# Patient Record
Sex: Male | Born: 1955 | Race: White | Hispanic: No | Marital: Single | Smoking: Former smoker
Health system: Southern US, Community
[De-identification: ages and names within clinical notes are randomized; demographics above are authoritative.]

## PROBLEM LIST (undated history)

## (undated) DIAGNOSIS — Z9889 Other specified postprocedural states: Secondary | ICD-10-CM

## (undated) DIAGNOSIS — R112 Nausea with vomiting, unspecified: Secondary | ICD-10-CM

## (undated) DIAGNOSIS — I10 Essential (primary) hypertension: Secondary | ICD-10-CM

## (undated) DIAGNOSIS — F32A Depression, unspecified: Secondary | ICD-10-CM

## (undated) DIAGNOSIS — F329 Major depressive disorder, single episode, unspecified: Secondary | ICD-10-CM

## (undated) DIAGNOSIS — I499 Cardiac arrhythmia, unspecified: Secondary | ICD-10-CM

## (undated) HISTORY — PX: OTHER SURGICAL HISTORY: SHX169

## (undated) HISTORY — PX: NECK SURGERY: SHX720

---

## 2013-02-19 ENCOUNTER — Encounter (HOSPITAL_BASED_OUTPATIENT_CLINIC_OR_DEPARTMENT_OTHER): Payer: Self-pay | Admitting: Emergency Medicine

## 2013-02-19 ENCOUNTER — Emergency Department (HOSPITAL_BASED_OUTPATIENT_CLINIC_OR_DEPARTMENT_OTHER)
Admission: EM | Admit: 2013-02-19 | Discharge: 2013-02-19 | Disposition: A | Discharge status: Home / Self Care | Payer: Self-pay | Attending: Emergency Medicine | Admitting: Emergency Medicine

## 2013-02-19 DIAGNOSIS — Z87891 Personal history of nicotine dependence: Secondary | ICD-10-CM | POA: Insufficient documentation

## 2013-02-19 DIAGNOSIS — T792XXA Traumatic secondary and recurrent hemorrhage and seroma, initial encounter: Secondary | ICD-10-CM

## 2013-02-19 DIAGNOSIS — W010XXA Fall on same level from slipping, tripping and stumbling without subsequent striking against object, initial encounter: Secondary | ICD-10-CM | POA: Insufficient documentation

## 2013-02-19 DIAGNOSIS — S01409A Unspecified open wound of unspecified cheek and temporomandibular area, initial encounter: Secondary | ICD-10-CM | POA: Insufficient documentation

## 2013-02-19 DIAGNOSIS — Y939 Activity, unspecified: Secondary | ICD-10-CM | POA: Insufficient documentation

## 2013-02-19 DIAGNOSIS — R000 Tachycardia, unspecified: Secondary | ICD-10-CM | POA: Insufficient documentation

## 2013-02-19 DIAGNOSIS — Y929 Unspecified place or not applicable: Secondary | ICD-10-CM | POA: Insufficient documentation

## 2013-02-19 DIAGNOSIS — Z23 Encounter for immunization: Secondary | ICD-10-CM | POA: Insufficient documentation

## 2013-02-19 LAB — BASIC METABOLIC PANEL
BUN: 12 mg/dL (ref 6–23)
CO2: 22 mEq/L (ref 19–32)
Calcium: 8.9 mg/dL (ref 8.4–10.5)
Creatinine, Ser: 0.8 mg/dL (ref 0.50–1.35)
Glucose, Bld: 126 mg/dL — ABNORMAL HIGH (ref 70–99)

## 2013-02-19 LAB — CBC WITH DIFFERENTIAL/PLATELET
Basophils Absolute: 0 10*3/uL (ref 0.0–0.1)
Eosinophils Absolute: 0.2 10*3/uL (ref 0.0–0.7)
Eosinophils Relative: 2 % (ref 0–5)
HCT: 46.6 % (ref 39.0–52.0)
Lymphocytes Relative: 19 % (ref 12–46)
MCH: 31.9 pg (ref 26.0–34.0)
MCV: 92.8 fL (ref 78.0–100.0)
Monocytes Absolute: 0.9 10*3/uL (ref 0.1–1.0)
Neutro Abs: 6.1 10*3/uL (ref 1.7–7.7)
RBC: 5.02 MIL/uL (ref 4.22–5.81)
RDW: 13.7 % (ref 11.5–15.5)
WBC: 8.8 10*3/uL (ref 4.0–10.5)

## 2013-02-19 MED ORDER — TETANUS-DIPHTH-ACELL PERTUSSIS 5-2.5-18.5 LF-MCG/0.5 IM SUSP
0.5000 mL | Freq: Once | INTRAMUSCULAR | Status: AC
  Administered 2013-02-19: 0.5 mL via INTRAMUSCULAR
  Filled 2013-02-19: qty 0.5

## 2013-02-19 MED ORDER — "THROMBI-PAD 3""X3"" EX PADS"
1.0000 | MEDICATED_PAD | Freq: Once | CUTANEOUS | Status: AC
  Administered 2013-02-19: 1 via TOPICAL

## 2013-02-19 NOTE — ED Provider Notes (Signed)
CSN: [id number]     Arrival date & time 02/19/13  0703 History   First MD Initiated Contact with Patient 02/19/13 0715     Chief Complaint  Patient presents with  . Head Injury   (Consider location/radiation/quality/duration/timing/severity/associated sxs/prior Treatment) Patient is a 57 y.o. male presenting with facial injury. The history is provided by the patient. No language interpreter was used.  Facial Injury Mechanism of injury:  Fall Location:  L cheek Time since incident:  1 week Pain details:    Quality:  Aching   Severity:  Mild   Duration:  1 week   Timing:  Constant   Progression:  Worsening Chronicity:  New Foreign body present:  No foreign bodies Worsened by:  Nothing tried Ineffective treatments:  Ice pack (pressure) Associated symptoms: no congestion, no headaches, no nausea, no rhinorrhea and no vomiting   Associated symptoms comment:  Lightheadedness Risk factors: alcohol use   Risk factors: no prior injuries to these areas     History reviewed. No pertinent past medical history. History reviewed. No pertinent past surgical history. History reviewed. No pertinent family history. History  Substance Use Topics  . Smoking status: Former Games developer  . Smokeless tobacco: Not on file  . Alcohol Use: 7.2 oz/week    12 Cans of beer per week     Comment: per day    Review of Systems  Constitutional: Negative for fever, activity change, appetite change and fatigue.  HENT: Negative for congestion, facial swelling, rhinorrhea and trouble swallowing.   Eyes: Negative for photophobia and pain.  Respiratory: Negative for cough, chest tightness and shortness of breath.   Cardiovascular: Negative for chest pain and leg swelling.  Gastrointestinal: Negative for nausea, vomiting, abdominal pain, diarrhea and constipation.  Endocrine: Negative for polydipsia and polyuria.  Genitourinary: Negative for dysuria, urgency, decreased urine volume and difficulty urinating.    Musculoskeletal: Negative for back pain and gait problem.  Skin: Positive for wound. Negative for color change and rash.  Allergic/Immunologic: Negative for immunocompromised state.  Neurological: Negative for dizziness, facial asymmetry, speech difficulty, weakness, numbness and headaches.  Psychiatric/Behavioral: Negative for confusion, decreased concentration and agitation.    Allergies  Review of patient's allergies indicates no known allergies.  Home Medications  No current outpatient prescriptions on file. BP 179/126  Pulse 128  Temp(Src) 98.9 F (37.2 C) (Oral)  Resp 18  Ht 5\' 9"  (1.753 m)  Wt 190 lb (86.183 kg)  BMI 28.05 kg/m2  SpO2 100% Physical Exam  Constitutional: He is oriented to person, place, and time. He appears well-developed and well-nourished. No distress.  HENT:  Head: Normocephalic and atraumatic.    Mouth/Throat: No oropharyngeal exudate.  Eyes: Pupils are equal, round, and reactive to light.  Neck: Normal range of motion. Neck supple.  Cardiovascular: Regular rhythm and normal heart sounds.  Tachycardia present.  Exam reveals no gallop and no friction rub.   No murmur heard. Pulmonary/Chest: Effort normal and breath sounds normal. No respiratory distress. He has no wheezes. He has no rales.  Abdominal: Soft. Bowel sounds are normal. He exhibits no distension and no mass. There is no tenderness. There is no rebound and no guarding.  Musculoskeletal: Normal range of motion. He exhibits no edema and no tenderness.  Neurological: He is alert and oriented to person, place, and time.  Skin: Skin is warm and dry.  Psychiatric: He has a normal mood and affect.    ED Course  Procedures (including critical care time) Labs Review Labs  Reviewed  BASIC METABOLIC PANEL - Abnormal; Notable for the following:    Glucose, Bld 126 (*)    All other components within normal limits  CBC WITH DIFFERENTIAL   Imaging Review No results found.  EKG Interpretation    None       MDM   1. Late effect of facial wound, initial encounter   2. Bleeding from wound, initial encounter    Pt is a 57 y.o. male with Pmhx as above who presents with bleeding wound to L cheek after hitting face on fireplace log rack about 1 week ago. Pt states he must of pulled the scab off and it has been bleeding intermittently for about 2 days.  On PE, Pt tachycardic, hypertensive, but in NAD.  Wound is an avulsion type wound with central puncture w/ dark, oozing bleeding c/w venous bleed.  Pressure applied.  Thrombi-pad will be placed. Pt states he has fel somewhat lightheaded.  He will not consent to a tetanus update.    9:02 AM Bleeding controlled w/ application of thrombi-pad.  Tetanus updated.  Hb stable.  Pt safe for d/c.  Return precautions given for new or worsening symptoms including uncontrolled bleeding. Pt asked to est w/ PCP for blood pressure monitoring.     Shanna Cisco, MD 02/19/13 [phone]

## 2013-02-19 NOTE — ED Notes (Signed)
Pt amb to room 9 with quick steady gait in nad. Pt reports trip and fall onto a fireplace grate last Tuesday. Pt states he cleaned it up and thought it would heal without intervention. Pt reports some swelling to left cheek area, states "I must have knocked the scab off Monday and it's been oozing ever since". Pt went to work this am and his boss told him he could not work with his face bleeding. Small puncture wound noted to left cheek. Pt states td is not utd "but I don't want one!". Denies any other injuries or c/o.

## 2013-02-19 NOTE — ED Notes (Signed)
Cheek cleansed with wound cleanser, dsd held with pressure to cheek by pt.

## 2013-02-19 NOTE — ED Notes (Signed)
Thrombi pad applied to cheek with pressure held by pt. Pt assisted to position of comfort, labs drawn, placed on cardiac monitoring. Pt denies any c/o.

## 2013-02-19 NOTE — ED Notes (Signed)
MD at bedside. 

## 2013-03-27 ENCOUNTER — Emergency Department (INDEPENDENT_AMBULATORY_CARE_PROVIDER_SITE_OTHER)
Admission: EM | Admit: 2013-03-27 | Discharge: 2013-03-27 | Disposition: A | Discharge status: Home / Self Care | Payer: Self-pay | Source: Home / Self Care | Attending: Family Medicine | Admitting: Family Medicine

## 2013-03-27 ENCOUNTER — Encounter (HOSPITAL_COMMUNITY): Payer: Self-pay | Admitting: Emergency Medicine

## 2013-03-27 DIAGNOSIS — I10 Essential (primary) hypertension: Secondary | ICD-10-CM

## 2013-03-27 DIAGNOSIS — H1130 Conjunctival hemorrhage, unspecified eye: Secondary | ICD-10-CM

## 2013-03-27 MED ORDER — LISINOPRIL 10 MG PO TABS: 10.0000 mg | ORAL_TABLET | Freq: Every day | ORAL | Status: DC

## 2013-03-27 NOTE — ED Provider Notes (Signed)
Alex Torres is a 58 y.o. male who presents to Urgent Care today for right eye redness  1) patient notes pain with left eye redness after straining to lift a heavy pipe today at work. He is completely asymptomatic. His coworkers recommend that he came in today to get evaluation.  2) elevated blood pressure. Patient was told that he had blood pressure at his last visit. He is asymptomatic no chest pains palpitations or shortness of breath. No syncope or dizziness    History reviewed. No pertinent past medical history. History  Substance Use Topics  . Smoking status: Former Research scientist (life sciences)  . Smokeless tobacco: Not on file  . Alcohol Use: 7.2 oz/week    12 Cans of beer per week     Comment: per day   ROS as above Medications: No current facility-administered medications for this encounter.   Current Outpatient Prescriptions  Medication Sig Dispense Refill  . lisinopril (PRINIVIL,ZESTRIL) 10 MG tablet Take 1 tablet (10 mg total) by mouth daily.  30 tablet  1    Exam:  BP 181/90  Pulse 90  Temp(Src) 98.3 F (36.8 C) (Oral)  Resp 18  SpO2 100% Gen: Well NAD HEENT: EOMI,  MMM right eye conjunctival hemorrhage involving the 6:00 to 9:00 position of the sclera only. PERRLA Lungs: Normal work of breathing. CTABL Heart: RRR no MRG Abd: NABS, Soft. NT, ND Exts: Brisk capillary refill, warm and well perfused.    Assessment and Plan: 58 y.o. male with  1) right eye subconjunctival hemorrhage. Watchful waiting reassurance.  2) elevated blood pressure: Hypertension. Patient has had 2 separate readings with elevated blood pressure now.  Plan to start treatment with lisinopril. Creatinine and potassium were normal about a month ago. Followup with primary care provider  Discussed warning signs or symptoms. Please see discharge instructions. Patient expresses understanding.    Gregor Hams, MD 03/27/13 1325

## 2013-03-27 NOTE — ED Notes (Signed)
C/O BLOOD SHOT RIGHT EYE WHICH STARTED THIS MORNING STATES HE DID STRAINED THIS MORNING AS HE LIFTED A HEAVY PIPE

## 2013-03-27 NOTE — Discharge Instructions (Signed)
Thank you for coming in today. Your eye will slowly get better over a period of a few weeks. Your blood pressure continues to be elevated. Start taking lisinopril daily tomorrow Followup with a primary care Dr. soon as possible Please see Bonna Gains to qualify for reduced or free medical services within the [place].  Call her at [phone] today. [address], [place] [address] [phone]   Arterial Hypertension Arterial hypertension (high blood pressure) is a condition of elevated pressure in your blood vessels. Hypertension over a long period of time is a risk factor for strokes, heart attacks, and heart failure. It is also the leading cause of kidney (renal) failure.  CAUSES   In Adults -- Over 90% of all hypertension has no known cause. This is called essential or primary hypertension. In the other 10% of people with hypertension, the increase in blood pressure is caused by another disorder. This is called secondary hypertension. Important causes of secondary hypertension are:  Heavy alcohol use.  Obstructive sleep apnea.  Hyperaldosterosim (Conn's syndrome).  Steroid use.  Chronic kidney failure.  Hyperparathyroidism.  Medications.  Renal artery stenosis.  Pheochromocytoma.  Cushing's disease.  Coarctation of the aorta.  Scleroderma renal crisis.  Licorice (in excessive amounts).  Drugs (cocaine, methamphetamine). Your caregiver can explain any items above that apply to you.  In Children -- Secondary hypertension is more common and should always be considered.  Pregnancy -- Few women of childbearing age have high blood pressure. However, up to 10% of them develop hypertension of pregnancy. Generally, this will not harm the woman. It may be a sign of 3 complications of pregnancy: preeclampsia, HELLP syndrome, and eclampsia. Follow up and control with medication is necessary. SYMPTOMS    This condition normally does not produce any noticeable symptoms. It is usually found during a routine exam.  Malignant hypertension is a late problem of high blood pressure. It may have the following symptoms:  Headaches.  Blurred vision.  End-organ damage (this means your kidneys, heart, lungs, and other organs are being damaged).  Stressful situations can increase the blood pressure. If a person with normal blood pressure has their blood pressure go up while being seen by their caregiver, this is often termed "white coat hypertension." Its importance is not known. It may be related with eventually developing hypertension or complications of hypertension.  Hypertension is often confused with mental tension, stress, and anxiety. DIAGNOSIS  The diagnosis is made by 3 separate blood pressure measurements. They are taken at least 1 week apart from each other. If there is organ damage from hypertension, the diagnosis may be made without repeat measurements. Hypertension is usually identified by having blood pressure readings:  Above 140/90 mmHg measured in both arms, at 3 separate times, over a couple weeks.  Over 130/80 mmHg should be considered a risk factor and may require treatment in patients with diabetes. Blood pressure readings over 120/80 mmHg are called "pre-hypertension" even in non-diabetic patients. To get a true blood pressure measurement, use the following guidelines. Be aware of the factors that can alter blood pressure readings.  Take measurements at least 1 hour after caffeine.  Take measurements 30 minutes after smoking and without any stress. This is another reason to quit smoking  it raises your blood pressure.  Use a proper cuff size. Ask your caregiver if you are not sure about your cuff size.  Most home blood pressure cuffs are automatic. They  will measure systolic and diastolic pressures. The systolic pressure is the pressure reading at the start of sounds.  Diastolic pressure is the pressure at which the sounds disappear. If you are elderly, measure pressures in multiple postures. Try sitting, lying or standing.  Sit at rest for a minimum of 5 minutes before taking measurements.  You should not be on any medications like decongestants. These are found in many cold medications.  Record your blood pressure readings and review them with your caregiver. If you have hypertension:  Your caregiver may do tests to be sure you do not have secondary hypertension (see "causes" above).  Your caregiver may also look for signs of metabolic syndrome. This is also called Syndrome X or Insulin Resistance Syndrome. You may have this syndrome if you have type 2 diabetes, abdominal obesity, and abnormal blood lipids in addition to hypertension.  Your caregiver will take your medical and family history and perform a physical exam.  Diagnostic tests may include blood tests (for glucose, cholesterol, potassium, and kidney function), a urinalysis, or an EKG. Other tests may also be necessary depending on your condition. PREVENTION  There are important lifestyle issues that you can adopt to reduce your chance of developing hypertension:  Maintain a normal weight.  Limit the amount of salt (sodium) in your diet.  Exercise often.  Limit alcohol intake.  Get enough potassium in your diet. Discuss specific advice with your caregiver.  Follow a DASH diet (dietary approaches to stop hypertension). This diet is rich in fruits, vegetables, and low-fat dairy products, and avoids certain fats. PROGNOSIS  Essential hypertension cannot be cured. Lifestyle changes and medical treatment can lower blood pressure and reduce complications. The prognosis of secondary hypertension depends on the underlying cause. Many people whose hypertension is controlled with medicine or lifestyle changes can live a normal, healthy life.  RISKS AND COMPLICATIONS  While high blood pressure  alone is not an illness, it often requires treatment due to its short- and long-term effects on many organs. Hypertension increases your risk for:  CVAs or strokes (cerebrovascular accident).  Heart failure due to chronically high blood pressure (hypertensive cardiomyopathy).  Heart attack (myocardial infarction).  Damage to the retina (hypertensive retinopathy).  Kidney failure (hypertensive nephropathy). Your caregiver can explain list items above that apply to you. Treatment of hypertension can significantly reduce the risk of complications. TREATMENT   For overweight patients, weight loss and regular exercise are recommended. Physical fitness lowers blood pressure.  Mild hypertension is usually treated with diet and exercise. A diet rich in fruits and vegetables, fat-free dairy products, and foods low in fat and salt (sodium) can help lower blood pressure. Decreasing salt intake decreases blood pressure in a 1/3 of people.  Stop smoking if you are a smoker. The steps above are highly effective in reducing blood pressure. While these actions are easy to suggest, they are difficult to achieve. Most patients with moderate or severe hypertension end up requiring medications to bring their blood pressure down to a normal level. There are several classes of medications for treatment. Blood pressure pills (antihypertensives) will lower blood pressure by their different actions. Lowering the blood pressure by 10 mmHg may decrease the risk of complications by as much as 25%. The goal of treatment is effective blood pressure control. This will reduce your risk for complications. Your caregiver will help you determine the best treatment for you according to your lifestyle. What is excellent treatment for one person, may not be for you.  HOME CARE INSTRUCTIONS   Do not smoke.  Follow the lifestyle changes outlined in the "Prevention" section.  If you are on medications, follow the directions  carefully. Blood pressure medications must be taken as prescribed. Skipping doses reduces their benefit. It also puts you at risk for problems.  Follow up with your caregiver, as directed.  If you are asked to monitor your blood pressure at home, follow the guidelines in the "Diagnosis" section above. SEEK MEDICAL CARE IF:   You think you are having medication side effects.  You have recurrent headaches or lightheadedness.  You have swelling in your ankles.  You have trouble with your vision. SEEK IMMEDIATE MEDICAL CARE IF:   You have sudden onset of chest pain or pressure, difficulty breathing, or other symptoms of a heart attack.  You have a severe headache.  You have symptoms of a stroke (such as sudden weakness, difficulty speaking, difficulty walking). MAKE SURE YOU:   Understand these instructions.  Will watch your condition.  Will get help right away if you are not doing well or get worse. Document Released: 02/06/2005 Document Revised: 05/01/2011 Document Reviewed: 09/06/2006 [facility] Patient Information 2014 [place]. Subconjunctival Hemorrhage A subconjunctival hemorrhage is a bright red patch covering a portion of the white of the eye. The white part of the eye is called the sclera, and it is covered by a thin membrane called the conjunctiva. This membrane is clear, except for tiny blood vessels that you can see with the naked eye. When your eye is irritated or inflamed and becomes red, it is because the vessels in the conjunctiva are swollen. Sometimes, a blood vessel in the conjunctiva can break and bleed. When this occurs, the blood builds up between the conjunctiva and the sclera, and spreads out to create a red area. The red spot may be very small at first. It may then spread to cover a larger part of the surface of the eye, or even all of the visible white part of the eye. In almost all cases, the blood will go away and the eye will become white again. Before  completely dissolving, however, the red area may spread. It may also become brownish-yellow in color, before going away. If a lot of blood collects under the conjunctiva, it may look like a bulge on the surface of the eye. This looks scary, but it will also eventually flatten out and go away. Subconjunctival hemorrhages do not cause pain, but if swollen, may cause a feeling of irritation. There is no effect on vision.  CAUSES   The most common cause is mild trauma (rubbing the eye, irritation).  Subconjunctival hemorrhages can happen because of coughing or straining (lifting heavy objects), vomiting, or sneezing.  In some cases, your doctor may want to check your blood pressure. High blood pressure can also cause a sunconjunctival hemorrhage.  Severe trauma or blunt injuries.  Diseases that affect blood clotting (hemophilia, leukemia).  Abnormalities of blood vessels behind the eye (carotid cavernous sinus fistula).  Tumors behind the eye.  Certain drugs (aspirin, coumadin, heparin).  Recent eye surgery. HOME CARE INSTRUCTIONS   Do not worry about the appearance of your eye. You may continue your usual activities.  Often, follow-up is not necessary. SEEK MEDICAL CARE IF:   Your eye becomes painful.  The bleeding does not disappear within 3 weeks.  Bleeding occurs elsewhere, for example, under the skin, in the mouth, or in the other eye.  You have recurring subconjunctival hemorrhages. SEEK  IMMEDIATE MEDICAL CARE IF:   Your vision changes or you have difficulty seeing.  You develop severe headache, persistent vomiting, confusion, or abnormal drowsiness (lethargy).  Your eye seems to bulge or protrude from the eye socket.  You notice the sudden appearance of bruises, or have spontaneous bleeding elsewhere on your body. Document Released: 02/06/2005 Document Revised: 05/01/2011 Document Reviewed: 01/04/2009 [facility] Patient Information 2014 [place].

## 2016-07-24 ENCOUNTER — Telehealth: Payer: Self-pay | Admitting: Hematology

## 2016-07-24 ENCOUNTER — Encounter: Payer: Self-pay | Admitting: Hematology

## 2016-07-24 NOTE — Telephone Encounter (Signed)
Appt has been scheduled for the pt to see Dr. Irene Limbo on 6/20 at 11am. Will fax a letter to the referring to notify the pt and mail a letter.

## 2016-08-09 ENCOUNTER — Ambulatory Visit (HOSPITAL_BASED_OUTPATIENT_CLINIC_OR_DEPARTMENT_OTHER): Payer: Commercial Managed Care - PPO

## 2016-08-09 ENCOUNTER — Encounter: Payer: Self-pay | Admitting: Hematology

## 2016-08-09 ENCOUNTER — Ambulatory Visit (HOSPITAL_BASED_OUTPATIENT_CLINIC_OR_DEPARTMENT_OTHER): Payer: Commercial Managed Care - PPO | Admitting: Hematology

## 2016-08-09 ENCOUNTER — Telehealth: Payer: Self-pay | Admitting: Hematology

## 2016-08-09 VITALS — BP 143/90 | HR 107 | Temp 99.2°F | Resp 18 | Ht 69.0 in | Wt 190.5 lb

## 2016-08-09 DIAGNOSIS — R5383 Other fatigue: Secondary | ICD-10-CM | POA: Diagnosis not present

## 2016-08-09 DIAGNOSIS — D45 Polycythemia vera: Secondary | ICD-10-CM

## 2016-08-09 DIAGNOSIS — Q8901 Asplenia (congenital): Secondary | ICD-10-CM

## 2016-08-09 DIAGNOSIS — D751 Secondary polycythemia: Secondary | ICD-10-CM | POA: Diagnosis not present

## 2016-08-09 DIAGNOSIS — M542 Cervicalgia: Secondary | ICD-10-CM

## 2016-08-09 DIAGNOSIS — I10 Essential (primary) hypertension: Secondary | ICD-10-CM | POA: Diagnosis not present

## 2016-08-09 DIAGNOSIS — Z9081 Acquired absence of spleen: Secondary | ICD-10-CM | POA: Diagnosis not present

## 2016-08-09 LAB — CBC & DIFF AND RETIC
BASO%: 0.4 % (ref 0.0–2.0)
BASOS ABS: 0 10*3/uL (ref 0.0–0.1)
EOS%: 1.4 % (ref 0.0–7.0)
Eosinophils Absolute: 0.1 10*3/uL (ref 0.0–0.5)
HEMATOCRIT: 52 % — AB (ref 38.4–49.9)
HEMOGLOBIN: 17.5 g/dL — AB (ref 13.0–17.1)
IMMATURE RETIC FRACT: 1.7 % — AB (ref 3.00–10.60)
LYMPH%: 20.2 % (ref 14.0–49.0)
MCH: 33.7 pg — ABNORMAL HIGH (ref 27.2–33.4)
MCHC: 33.7 g/dL (ref 32.0–36.0)
MCV: 100 fL — ABNORMAL HIGH (ref 79.3–98.0)
MONO#: 1 10*3/uL — AB (ref 0.1–0.9)
MONO%: 10.7 % (ref 0.0–14.0)
NEUT%: 67.3 % (ref 39.0–75.0)
NEUTROS ABS: 6.3 10*3/uL (ref 1.5–6.5)
Platelets: 189 10*3/uL (ref 140–400)
RBC: 5.2 10*6/uL (ref 4.20–5.82)
RDW: 13 % (ref 11.0–14.6)
RETIC %: 0.78 % — AB (ref 0.80–1.80)
Retic Ct Abs: 40.56 10*3/uL (ref 34.80–93.90)
WBC: 9.4 10*3/uL (ref 4.0–10.3)
lymph#: 1.9 10*3/uL (ref 0.9–3.3)

## 2016-08-09 LAB — COMPREHENSIVE METABOLIC PANEL
ALBUMIN: 3.9 g/dL (ref 3.5–5.0)
ALK PHOS: 72 U/L (ref 40–150)
ALT: 33 U/L (ref 0–55)
AST: 30 U/L (ref 5–34)
Anion Gap: 12 mEq/L — ABNORMAL HIGH (ref 3–11)
BILIRUBIN TOTAL: 0.6 mg/dL (ref 0.20–1.20)
BUN: 13 mg/dL (ref 7.0–26.0)
CO2: 27 mEq/L (ref 22–29)
Calcium: 10.3 mg/dL (ref 8.4–10.4)
Chloride: 104 mEq/L (ref 98–109)
Creatinine: 0.9 mg/dL (ref 0.7–1.3)
EGFR: >90 mL/min/{1.73_m2} (ref >90)
Glucose: 106 mg/dl (ref 70–140)
Potassium: 5 mEq/L (ref 3.5–5.1)
SODIUM: 143 meq/L (ref 136–145)
TOTAL PROTEIN: 7.8 g/dL (ref 6.4–8.3)
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
## 2016-08-09 NOTE — Telephone Encounter (Signed)
Scheduled appt per 6/20 los - Dr. Irene Limbo on pal in 3 weeks - scheduled patient for 4 weeks. Gave patient AVS and calender,.

## 2016-08-09 NOTE — Patient Instructions (Signed)
Thank you for choosing [facility] to provide your oncology and hematology care.  To afford each patient quality time with our providers, please arrive 30 minutes before your scheduled appointment time.  If you arrive late for your appointment, you may be asked to reschedule.  We strive to give you quality time with our providers, and arriving late affects you and other patients whose appointments are after yours.   If you are a no show for multiple scheduled visits, you may be dismissed from the clinic at the providers discretion.    Again, thank you for choosing [facility], our hope is that these requests will decrease the amount of time that you wait before being seen by our physicians.  ______________________________________________________________________  Should you have questions after your visit to the [facility], please contact our office at [phone] between the hours of 8:30 and 4:30 p.m.    Voicemails left after 4:30p.m will not be returned until the following business day.    For prescription refill requests, please have your pharmacy contact us directly.  Please also try to allow 48 hours for prescription requests.    Please contact the scheduling department for questions regarding scheduling.  For scheduling of procedures such as PET scans, CT scans, MRI, Ultrasound, etc please contact central scheduling at [phone].    Resources For Cancer Patients and Caregivers:   Oncolink.org:  A wonderful resource for patients and healthcare providers for information regarding your disease, ways to tract your treatment, what to expect, etc.     American Cancer Society:  800-227-2345  Can help patients locate various types of support and financial assistance  Cancer Care: 1-800-813-HOPE (4673) Provides financial assistance, online support groups, medication/co-pay assistance.    [place] DSS:  336-641-3447 Where to apply for food  stamps, Medicaid, and utility assistance  Medicare Rights Center: 800-333-4114 Helps people with Medicare understand their rights and benefits, navigate the Medicare system, and secure the quality healthcare they deserve  SCAT: 336-333-6589 [place] [employer]'s shared-ride transportation service for eligible riders who have a disability that prevents them from riding the fixed route bus.    For additional information on assistance programs please contact our social worker:   Grier Hock/Abigail Elmore:  [phone]            

## 2016-09-07 ENCOUNTER — Encounter: Payer: Self-pay | Admitting: Hematology

## 2016-09-07 ENCOUNTER — Ambulatory Visit (HOSPITAL_BASED_OUTPATIENT_CLINIC_OR_DEPARTMENT_OTHER): Payer: Commercial Managed Care - PPO | Admitting: Hematology

## 2016-09-07 VITALS — BP 147/95 | HR 106 | Temp 98.9°F | Resp 18 | Ht 69.0 in | Wt 189.9 lb

## 2016-09-07 DIAGNOSIS — R5383 Other fatigue: Secondary | ICD-10-CM

## 2016-09-07 DIAGNOSIS — D751 Secondary polycythemia: Secondary | ICD-10-CM | POA: Diagnosis not present

## 2016-09-07 DIAGNOSIS — Z9081 Acquired absence of spleen: Secondary | ICD-10-CM | POA: Diagnosis not present

## 2016-09-07 NOTE — Patient Instructions (Signed)
Thank you for choosing [facility] to provide your oncology and hematology care.  To afford each patient quality time with our providers, please arrive 30 minutes before your scheduled appointment time.  If you arrive late for your appointment, you may be asked to reschedule.  We strive to give you quality time with our providers, and arriving late affects you and other patients whose appointments are after yours.   If you are a no show for multiple scheduled visits, you may be dismissed from the clinic at the providers discretion.    Again, thank you for choosing [facility], our hope is that these requests will decrease the amount of time that you wait before being seen by our physicians.  ______________________________________________________________________  Should you have questions after your visit to the [facility], please contact our office at [phone] between the hours of 8:30 and 4:30 p.m.    Voicemails left after 4:30p.m will not be returned until the following business day.    For prescription refill requests, please have your pharmacy contact us directly.  Please also try to allow 48 hours for prescription requests.    Please contact the scheduling department for questions regarding scheduling.  For scheduling of procedures such as PET scans, CT scans, MRI, Ultrasound, etc please contact central scheduling at [phone].    Resources For Cancer Patients and Caregivers:   Oncolink.org:  A wonderful resource for patients and healthcare providers for information regarding your disease, ways to tract your treatment, what to expect, etc.     American Cancer Society:  800-227-2345  Can help patients locate various types of support and financial assistance  Cancer Care: 1-800-813-HOPE (4673) Provides financial assistance, online support groups, medication/co-pay assistance.    [place] DSS:  336-641-3447 Where to apply for food  stamps, Medicaid, and utility assistance  Medicare Rights Center: 800-333-4114 Helps people with Medicare understand their rights and benefits, navigate the Medicare system, and secure the quality healthcare they deserve  SCAT: 336-333-6589 [place] [employer]'s shared-ride transportation service for eligible riders who have a disability that prevents them from riding the fixed route bus.    For additional information on assistance programs please contact our social worker:   Grier Hock/Abigail Elmore:  [phone]            
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
## 2016-10-02 ENCOUNTER — Other Ambulatory Visit: Payer: Self-pay | Admitting: Family Medicine

## 2016-10-02 DIAGNOSIS — M5412 Radiculopathy, cervical region: Secondary | ICD-10-CM

## 2016-10-02 DIAGNOSIS — Z77018 Contact with and (suspected) exposure to other hazardous metals: Secondary | ICD-10-CM

## 2016-10-12 ENCOUNTER — Telehealth: Payer: Self-pay | Admitting: Hematology

## 2016-10-12 NOTE — Telephone Encounter (Signed)
[facility] FAXED RECORDS TO [place] [phone]

## 2016-10-13 ENCOUNTER — Other Ambulatory Visit: Payer: Commercial Managed Care - PPO

## 2016-12-12 ENCOUNTER — Other Ambulatory Visit: Payer: Self-pay | Admitting: Neurosurgery

## 2017-01-10 NOTE — Progress Notes (Signed)
PCP:  Cardiologist:  EKG:  Stress test:  ECHO:  Cardiac Cath:  Chest x-ray

## 2017-01-10 NOTE — Pre-Procedure Instructions (Signed)
Alex Torres  01/10/2017      [facility] - [place] - [address] [place] [address] Phone: [phone] Fax: [phone]    Your procedure is scheduled on January 17, 2017.  Report to [facility] Admitting at 800 AM.  Call this number if you have problems the morning of surgery:  [phone]   Remember:  Do not eat food or drink liquids after midnight.  Take these medicines the morning of surgery with A SIP OF WATER (none).  7 days prior to surgery STOP taking any meloxicam (mobic), Aspirin (unless otherwise instructed by your surgeon), Aleve, Naproxen, Ibuprofen, Motrin, Advil, Goody's, BC's, all herbal medications, fish oil, and all vitamins  Continue all other medications as instructed by your physician except follow the above medication instructions before surgery   Do not wear jewelry, make-up or nail polish.  Do not wear lotions, powders, or perfumes, or deoderant.  Do not shave 48 hours prior to surgery.    Do not bring valuables to the hospital.  [facility] is not responsible for any belongings or valuables.  Contacts, dentures or bridgework may not be worn into surgery.  Leave your suitcase in the car.  After surgery it may be brought to your room.  For patients admitted to the hospital, discharge time will be determined by your treatment team.  Patients discharged the day of surgery will not be allowed to drive home.    Special instructions:  [place]- Preparing For Surgery  Before surgery, you can play an important role. Because skin is not sterile, your skin needs to be as free of germs as possible. You can reduce the number of germs on your skin by washing with CHG (chlorahexidine gluconate) Soap before surgery.  CHG is an antiseptic cleaner which kills germs and bonds with the skin to continue killing germs even after washing.  Please do not use if you have an  allergy to CHG or antibacterial soaps. If your skin becomes reddened/irritated stop using the CHG.  Do not shave (including legs and underarms) for at least 48 hours prior to first CHG shower. It is OK to shave your face.  Please follow these instructions carefully.   1. Shower the NIGHT BEFORE SURGERY and the MORNING OF SURGERY with CHG.   2. If you chose to wash your hair, wash your hair first as usual with your normal shampoo.  3. After you shampoo, rinse your hair and body thoroughly to remove the shampoo.  4. Use CHG as you would any other liquid soap. You can apply CHG directly to the skin and wash gently with a scrungie or a clean washcloth.   5. Apply the CHG Soap to your body ONLY FROM THE NECK DOWN.  Do not use on open wounds or open sores. Avoid contact with your eyes, ears, mouth and genitals (private parts). Wash Face and genitals (private parts)  with your normal soap.  6. Wash thoroughly, paying special attention to the area where your surgery will be performed.  7. Thoroughly rinse your body with warm water from the neck down.  8. DO NOT shower/wash with your normal soap after using and rinsing off the CHG Soap.  9. Pat yourself dry with a CLEAN TOWEL.  10. Wear CLEAN PAJAMAS to bed the night before surgery, wear comfortable clothes the morning of surgery  11. Place CLEAN SHEETS on your bed the night  of your first shower and DO NOT SLEEP WITH PETS.    Day of Surgery: Do not apply any deodorants/lotions. Please wear clean clothes to the hospital/surgery center.     Please read over the following fact sheets that you were given. Pain Booklet, Coughing and Deep Breathing and Surgical Site Infection Prevention

## 2017-01-12 ENCOUNTER — Inpatient Hospital Stay (HOSPITAL_COMMUNITY)
Admission: RE | Admit: 2017-01-12 | Discharge: 2017-01-12 | Disposition: A | Discharge status: Home / Self Care | Payer: Commercial Managed Care - PPO | Source: Ambulatory Visit

## 2017-01-16 ENCOUNTER — Other Ambulatory Visit: Payer: Self-pay

## 2017-01-16 ENCOUNTER — Encounter (HOSPITAL_COMMUNITY)
Admission: RE | Admit: 2017-01-16 | Discharge: 2017-01-16 | Disposition: A | Discharge status: Home / Self Care | Payer: Commercial Managed Care - PPO | Source: Ambulatory Visit | Attending: Neurosurgery | Admitting: Neurosurgery

## 2017-01-16 ENCOUNTER — Encounter (HOSPITAL_COMMUNITY): Payer: Self-pay

## 2017-01-16 HISTORY — DX: Cardiac arrhythmia, unspecified: I49.9

## 2017-01-16 HISTORY — DX: Depression, unspecified: F32.A

## 2017-01-16 HISTORY — DX: Essential (primary) hypertension: I10

## 2017-01-16 HISTORY — DX: Other specified postprocedural states: Z98.890

## 2017-01-16 HISTORY — DX: Major depressive disorder, single episode, unspecified: F32.9

## 2017-01-16 HISTORY — DX: Other specified postprocedural states: R11.2

## 2017-01-16 HISTORY — DX: Nausea with vomiting, unspecified: R11.2

## 2017-01-16 LAB — CBC
HEMATOCRIT: 54.3 % — AB (ref 39.0–52.0)
HEMOGLOBIN: 18.4 g/dL — AB (ref 13.0–17.0)
MCH: 33.7 pg (ref 26.0–34.0)
MCHC: 33.9 g/dL (ref 30.0–36.0)
MCV: 99.5 fL (ref 78.0–100.0)
Platelets: 224 10*3/uL (ref 150–400)
RBC: 5.46 MIL/uL (ref 4.22–5.81)
RDW: 13.5 % (ref 11.5–15.5)
WBC: 11.7 10*3/uL — ABNORMAL HIGH (ref 4.0–10.5)

## 2017-01-16 LAB — COMPREHENSIVE METABOLIC PANEL
ALK PHOS: 71 U/L (ref 38–126)
ALT: 22 U/L (ref 17–63)
ANION GAP: 8 (ref 5–15)
AST: 26 U/L (ref 15–41)
Albumin: 3.9 g/dL (ref 3.5–5.0)
BILIRUBIN TOTAL: 0.5 mg/dL (ref 0.3–1.2)
BUN: 19 mg/dL (ref 6–20)
CALCIUM: 9.9 mg/dL (ref 8.9–10.3)
CO2: 24 mmol/L (ref 22–32)
CREATININE: 0.82 mg/dL (ref 0.61–1.24)
Chloride: 105 mmol/L (ref 101–111)
Glucose, Bld: 117 mg/dL — ABNORMAL HIGH (ref 65–99)
Potassium: 4.4 mmol/L (ref 3.5–5.1)
SODIUM: 137 mmol/L (ref 135–145)
TOTAL PROTEIN: 7.6 g/dL (ref 6.5–8.1)

## 2017-01-16 LAB — TYPE AND SCREEN
ABO/RH(D): O POS
Antibody Screen: NEGATIVE

## 2017-01-16 LAB — ABO/RH: ABO/RH(D): O POS

## 2017-01-16 LAB — SURGICAL PCR SCREEN
MRSA, PCR: NEGATIVE
Staphylococcus aureus: NEGATIVE

## 2017-01-16 NOTE — Progress Notes (Addendum)
PCP is Dr. Eliberto Ivory Denies ever seeing a cardiologist. Denies chest pain, fever, or cough  Denies ever having a card cath, stress test, or echo. Denies ever having an EKG. bp today 166/100 rechecked 145/100 Hr 120 rechecked 118 pt states he is stressed and had been in a lot of pain  Willeen Cass, FNP called and informed.

## 2017-01-16 NOTE — Progress Notes (Signed)
Anesthesia Chart Review:  Pt is a 61 year old male scheduled for C3-4, C4-5, C5-6 ACDF on 01/17/2017 with Kary Kos, MD  - PCP is Horald Pollen, MD - Saw hematologist Sullivan Lone, MD for polycythemia, thought secondary to sleep apnea or due to hemoconcentration from dehydration from alcohol use.  Last office visit 09/07/16. F/u prn recommended.   PMH includes:  HTN, dysrhythmia (unspecified), polycythemia, post-op N/V.  Drinks 6 beers per day. Smoked briefly as a teen.  Dips tobacco.  BMI 29. S/p splenectomy at age 65.   Medications include: lisinopril, meloxicam, ibuprofen.   VS 166/100, 140/100 HR 120, 118  Preoperative labs reviewed.    EKG 01/16/17:  - Sinus tachycardia (111 bpm) with 1st degree A-V block. RBBB. Septal infarct, age undetermined. Inferior infarct, age undetermined - Pt has not had an EKG previously.   I saw pt in pre-admission testing for uncontrolled HTN and tachycardia.  HR regular but is tachycardic.  No murmur.  Lungs CTA B.  No peripheral edema or carotid bruit.  Pt did take lisinopril today as prescribed. Denies cocaine or caffeine use.  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  Denies CP, SOB, dizziness.  Does feel his heart beat is irregular at times (this has been going on for years), but has never been told by a health care provider that his HR is irregular or that he has an arrhythmia.  Does not feel HR is fast or irregular today. Pt reports high stress today (was late for pre-admission testing, is running multiple errands today in preparation for surgery tomorrow).    Reviewed case with Dr. Ermalene Postin.   If no changes, I anticipate pt can proceed with surgery as scheduled. Pt understands if HR and BP higher tomorrow, case could be cancelled.   Willeen Cass, FNP-BC [facility] Stay [facility]/Anesthesiology Phone: [phone] 01/16/2017 3:57 PM

## 2017-01-16 NOTE — Progress Notes (Signed)
   01/16/17 1442  OBSTRUCTIVE SLEEP APNEA  Have you ever been diagnosed with sleep apnea through a sleep study? No  Do you snore loudly (loud enough to be heard through closed doors)?  1  Do you often feel tired, fatigued, or sleepy during the daytime (such as falling asleep during driving or talking to someone)? 0  Has anyone observed you stop breathing during your sleep? 1  Do you have, or are you being treated for high blood pressure? 1  BMI more than 35 kg/m2? 0  Age > 50 (1-yes) 1  Neck circumference greater than:Male 16 inches or larger, Male 17inches or larger? 0  Male Gender (Yes=1) 1  Obstructive Sleep Apnea Score 5  Score 5 or greater  Results sent to PCP

## 2017-01-17 ENCOUNTER — Inpatient Hospital Stay (HOSPITAL_COMMUNITY): Payer: Commercial Managed Care - PPO | Admitting: Anesthesiology

## 2017-01-17 ENCOUNTER — Encounter (HOSPITAL_COMMUNITY)
Admission: RE | Disposition: A | Discharge status: Home / Self Care | Payer: Self-pay | Source: Ambulatory Visit | Attending: Neurosurgery

## 2017-01-17 ENCOUNTER — Inpatient Hospital Stay (HOSPITAL_COMMUNITY): Payer: Commercial Managed Care - PPO

## 2017-01-17 ENCOUNTER — Inpatient Hospital Stay (HOSPITAL_COMMUNITY)
Admission: RE | Admit: 2017-01-17 | Discharge: 2017-01-18 | DRG: 552 | Disposition: A | Discharge status: Home / Self Care | Payer: Commercial Managed Care - PPO | Source: Ambulatory Visit | Attending: Neurosurgery | Admitting: Neurosurgery

## 2017-01-17 ENCOUNTER — Encounter (HOSPITAL_COMMUNITY): Payer: Self-pay

## 2017-01-17 ENCOUNTER — Inpatient Hospital Stay (HOSPITAL_COMMUNITY): Payer: Commercial Managed Care - PPO | Admitting: Emergency Medicine

## 2017-01-17 DIAGNOSIS — M5001 Cervical disc disorder with myelopathy,  high cervical region: Secondary | ICD-10-CM | POA: Diagnosis not present

## 2017-01-17 DIAGNOSIS — Z419 Encounter for procedure for purposes other than remedying health state, unspecified: Secondary | ICD-10-CM

## 2017-01-17 DIAGNOSIS — D751 Secondary polycythemia: Secondary | ICD-10-CM | POA: Diagnosis not present

## 2017-01-17 DIAGNOSIS — I440 Atrioventricular block, first degree: Secondary | ICD-10-CM | POA: Diagnosis present

## 2017-01-17 DIAGNOSIS — M4802 Spinal stenosis, cervical region: Secondary | ICD-10-CM | POA: Diagnosis present

## 2017-01-17 DIAGNOSIS — M4712 Other spondylosis with myelopathy, cervical region: Secondary | ICD-10-CM | POA: Diagnosis present

## 2017-01-17 DIAGNOSIS — Z23 Encounter for immunization: Secondary | ICD-10-CM | POA: Diagnosis not present

## 2017-01-17 DIAGNOSIS — I10 Essential (primary) hypertension: Secondary | ICD-10-CM | POA: Diagnosis not present

## 2017-01-17 DIAGNOSIS — Z9081 Acquired absence of spleen: Secondary | ICD-10-CM

## 2017-01-17 DIAGNOSIS — Z79899 Other long term (current) drug therapy: Secondary | ICD-10-CM

## 2017-01-17 DIAGNOSIS — I4510 Unspecified right bundle-branch block: Secondary | ICD-10-CM | POA: Diagnosis present

## 2017-01-17 DIAGNOSIS — F17220 Nicotine dependence, chewing tobacco, uncomplicated: Secondary | ICD-10-CM | POA: Diagnosis not present

## 2017-01-17 DIAGNOSIS — G959 Disease of spinal cord, unspecified: Secondary | ICD-10-CM | POA: Diagnosis present

## 2017-01-17 DIAGNOSIS — R000 Tachycardia, unspecified: Secondary | ICD-10-CM | POA: Diagnosis not present

## 2017-01-17 HISTORY — PX: ANTERIOR CERVICAL DECOMP/DISCECTOMY FUSION: SHX1161

## 2017-01-17 SURGERY — ANTERIOR CERVICAL DECOMPRESSION/DISCECTOMY FUSION 3 LEVELS
Anesthesia: General

## 2017-01-17 MED ORDER — SODIUM CHLORIDE 0.9 % IR SOLN
Status: DC | PRN
  Administered 2017-01-17: 500 mL

## 2017-01-17 MED ORDER — DEXAMETHASONE SODIUM PHOSPHATE 10 MG/ML IJ SOLN
10.0000 mg | INTRAMUSCULAR | Status: AC
  Administered 2017-01-17: 10 mg via INTRAVENOUS

## 2017-01-17 MED ORDER — 0.9 % SODIUM CHLORIDE (POUR BTL) OPTIME
TOPICAL | Status: DC | PRN
  Administered 2017-01-17: 500 mL
  Administered 2017-01-17: 1000 mL

## 2017-01-17 MED ORDER — THROMBIN (RECOMBINANT) 5000 UNITS EX SOLR
CUTANEOUS | Status: DC | PRN
  Administered 2017-01-17: 5000 [IU] via TOPICAL

## 2017-01-17 MED ORDER — CHLORHEXIDINE GLUCONATE CLOTH 2 % EX PADS: 6.0000 | MEDICATED_PAD | Freq: Once | CUTANEOUS | Status: DC

## 2017-01-17 MED ORDER — FENTANYL CITRATE (PF) 250 MCG/5ML IJ SOLN
INTRAMUSCULAR | Status: AC
  Filled 2017-01-17: qty 5

## 2017-01-17 MED ORDER — FENTANYL CITRATE (PF) 100 MCG/2ML IJ SOLN
25.0000 ug | INTRAMUSCULAR | Status: DC | PRN
  Administered 2017-01-17 (×2): 50 ug via INTRAVENOUS

## 2017-01-17 MED ORDER — SODIUM CHLORIDE 0.9% FLUSH: 3.0000 mL | INTRAVENOUS | Status: DC | PRN

## 2017-01-17 MED ORDER — LIDOCAINE HCL (CARDIAC) 20 MG/ML IV SOLN
INTRAVENOUS | Status: DC | PRN
  Administered 2017-01-17: 60 mg via INTRAVENOUS

## 2017-01-17 MED ORDER — HYDROMORPHONE HCL 1 MG/ML IJ SOLN
0.5000 mg | INTRAMUSCULAR | Status: DC | PRN
  Administered 2017-01-17: 0.5 mg via INTRAVENOUS
  Filled 2017-01-17: qty 0.5

## 2017-01-17 MED ORDER — PHENYLEPHRINE 40 MCG/ML (10ML) SYRINGE FOR IV PUSH (FOR BLOOD PRESSURE SUPPORT)
PREFILLED_SYRINGE | INTRAVENOUS | Status: AC
Start: 2017-01-17 — End: 2017-01-17
  Filled 2017-01-17: qty 10

## 2017-01-17 MED ORDER — DEXAMETHASONE SODIUM PHOSPHATE 10 MG/ML IJ SOLN
INTRAMUSCULAR | Status: DC | PRN
  Administered 2017-01-17: 10 mg via INTRAVENOUS

## 2017-01-17 MED ORDER — THROMBIN (RECOMBINANT) 20000 UNITS EX SOLR
CUTANEOUS | Status: AC
  Filled 2017-01-17: qty 20000

## 2017-01-17 MED ORDER — ONDANSETRON HCL 4 MG PO TABS: 4.0000 mg | ORAL_TABLET | Freq: Four times a day (QID) | ORAL | Status: DC | PRN

## 2017-01-17 MED ORDER — PHENYLEPHRINE HCL 10 MG/ML IJ SOLN
INTRAMUSCULAR | Status: DC | PRN
  Administered 2017-01-17: 20 ug/min via INTRAVENOUS

## 2017-01-17 MED ORDER — MENTHOL 3 MG MT LOZG
1.0000 | LOZENGE | OROMUCOSAL | Status: DC | PRN
  Filled 2017-01-17: qty 9

## 2017-01-17 MED ORDER — OXYCODONE HCL 5 MG PO TABS
ORAL_TABLET | ORAL | Status: AC
  Filled 2017-01-17: qty 2

## 2017-01-17 MED ORDER — CEFAZOLIN SODIUM-DEXTROSE 2-4 GM/100ML-% IV SOLN
2.0000 g | Freq: Three times a day (TID) | INTRAVENOUS | Status: AC
  Administered 2017-01-17 – 2017-01-18 (×2): 2 g via INTRAVENOUS
  Filled 2017-01-17 (×2): qty 100

## 2017-01-17 MED ORDER — CYCLOBENZAPRINE HCL 10 MG PO TABS
ORAL_TABLET | ORAL | Status: AC
  Filled 2017-01-17: qty 1

## 2017-01-17 MED ORDER — METOPROLOL TARTRATE 5 MG/5ML IV SOLN
INTRAVENOUS | Status: DC | PRN
  Administered 2017-01-17: 2 mg via INTRAVENOUS

## 2017-01-17 MED ORDER — PROMETHAZINE HCL 25 MG/ML IJ SOLN: 6.2500 mg | INTRAMUSCULAR | Status: DC | PRN

## 2017-01-17 MED ORDER — THROMBIN (RECOMBINANT) 5000 UNITS EX SOLR
CUTANEOUS | Status: AC
  Filled 2017-01-17: qty 5000

## 2017-01-17 MED ORDER — CEFAZOLIN SODIUM-DEXTROSE 2-4 GM/100ML-% IV SOLN
INTRAVENOUS | Status: AC
  Filled 2017-01-17: qty 100

## 2017-01-17 MED ORDER — SUGAMMADEX SODIUM 200 MG/2ML IV SOLN
INTRAVENOUS | Status: DC | PRN
  Administered 2017-01-17: 200 mg via INTRAVENOUS

## 2017-01-17 MED ORDER — CYCLOBENZAPRINE HCL 10 MG PO TABS
10.0000 mg | ORAL_TABLET | Freq: Three times a day (TID) | ORAL | Status: DC | PRN
  Administered 2017-01-17 – 2017-01-18 (×3): 10 mg via ORAL
  Filled 2017-01-17 (×2): qty 1

## 2017-01-17 MED ORDER — DEXAMETHASONE SODIUM PHOSPHATE 10 MG/ML IJ SOLN
INTRAMUSCULAR | Status: AC
  Filled 2017-01-17: qty 1

## 2017-01-17 MED ORDER — LISINOPRIL 20 MG PO TABS
20.0000 mg | ORAL_TABLET | Freq: Every day | ORAL | Status: DC
  Administered 2017-01-17 – 2017-01-18 (×2): 20 mg via ORAL
  Filled 2017-01-17 (×2): qty 1

## 2017-01-17 MED ORDER — PROPOFOL 10 MG/ML IV BOLUS
INTRAVENOUS | Status: DC | PRN
  Administered 2017-01-17: 200 mg via INTRAVENOUS
  Administered 2017-01-17: 50 mg via INTRAVENOUS

## 2017-01-17 MED ORDER — ONDANSETRON HCL 4 MG/2ML IJ SOLN
INTRAMUSCULAR | Status: DC | PRN
  Administered 2017-01-17: 4 mg via INTRAVENOUS

## 2017-01-17 MED ORDER — LACTATED RINGERS IV SOLN
INTRAVENOUS | Status: DC | PRN
  Administered 2017-01-17 (×2): via INTRAVENOUS

## 2017-01-17 MED ORDER — ALBUMIN HUMAN 5 % IV SOLN
INTRAVENOUS | Status: DC | PRN
  Administered 2017-01-17: 12:00:00 via INTRAVENOUS

## 2017-01-17 MED ORDER — ACETAMINOPHEN 650 MG RE SUPP: 650.0000 mg | RECTAL | Status: DC | PRN

## 2017-01-17 MED ORDER — ROCURONIUM BROMIDE 100 MG/10ML IV SOLN
INTRAVENOUS | Status: DC | PRN
  Administered 2017-01-17: 40 mg via INTRAVENOUS
  Administered 2017-01-17: 60 mg via INTRAVENOUS

## 2017-01-17 MED ORDER — HEMOSTATIC AGENTS (NO CHARGE) OPTIME
TOPICAL | Status: DC | PRN
  Administered 2017-01-17 (×2): 1 via TOPICAL

## 2017-01-17 MED ORDER — NALOXONE HCL 0.4 MG/ML IJ SOLN
INTRAMUSCULAR | Status: AC
  Filled 2017-01-17: qty 1

## 2017-01-17 MED ORDER — THROMBIN (RECOMBINANT) 20000 UNITS EX SOLR
CUTANEOUS | Status: DC | PRN
  Administered 2017-01-17: 20000 [IU] via TOPICAL

## 2017-01-17 MED ORDER — THROMBIN (RECOMBINANT) 20000 UNITS EX SOLR: CUTANEOUS | Status: DC | PRN

## 2017-01-17 MED ORDER — FENTANYL CITRATE (PF) 100 MCG/2ML IJ SOLN
INTRAMUSCULAR | Status: AC
  Filled 2017-01-17: qty 2

## 2017-01-17 MED ORDER — SODIUM CHLORIDE 0.9% FLUSH
3.0000 mL | Freq: Two times a day (BID) | INTRAVENOUS | Status: DC
  Administered 2017-01-17: 3 mL via INTRAVENOUS

## 2017-01-17 MED ORDER — GLYCOPYRROLATE 0.2 MG/ML IJ SOLN
INTRAMUSCULAR | Status: DC | PRN
  Administered 2017-01-17 (×2): 0.1 mg via INTRAVENOUS

## 2017-01-17 MED ORDER — ONDANSETRON HCL 4 MG/2ML IJ SOLN: 4.0000 mg | Freq: Four times a day (QID) | INTRAMUSCULAR | Status: DC | PRN

## 2017-01-17 MED ORDER — FENTANYL CITRATE (PF) 100 MCG/2ML IJ SOLN
INTRAMUSCULAR | Status: DC | PRN
  Administered 2017-01-17 (×4): 50 ug via INTRAVENOUS
  Administered 2017-01-17: 100 ug via INTRAVENOUS
  Administered 2017-01-17: 50 ug via INTRAVENOUS

## 2017-01-17 MED ORDER — MIDAZOLAM HCL 5 MG/5ML IJ SOLN
INTRAMUSCULAR | Status: DC | PRN
  Administered 2017-01-17: 2 mg via INTRAVENOUS

## 2017-01-17 MED ORDER — OXYCODONE HCL 5 MG PO TABS
10.0000 mg | ORAL_TABLET | ORAL | Status: DC | PRN
  Administered 2017-01-17 – 2017-01-18 (×5): 10 mg via ORAL
  Filled 2017-01-17 (×4): qty 2

## 2017-01-17 MED ORDER — PANTOPRAZOLE SODIUM 40 MG IV SOLR
40.0000 mg | Freq: Every day | INTRAVENOUS | Status: DC
  Administered 2017-01-17: 40 mg via INTRAVENOUS
  Filled 2017-01-17: qty 40

## 2017-01-17 MED ORDER — CEFAZOLIN SODIUM-DEXTROSE 2-4 GM/100ML-% IV SOLN
2.0000 g | INTRAVENOUS | Status: AC
  Administered 2017-01-17: 2 g via INTRAVENOUS

## 2017-01-17 MED ORDER — PROPOFOL 10 MG/ML IV BOLUS
INTRAVENOUS | Status: AC
  Filled 2017-01-17: qty 20

## 2017-01-17 MED ORDER — MIDAZOLAM HCL 2 MG/2ML IJ SOLN
INTRAMUSCULAR | Status: AC
  Filled 2017-01-17: qty 2

## 2017-01-17 MED ORDER — PHENOL 1.4 % MT LIQD: 1.0000 | OROMUCOSAL | Status: DC | PRN

## 2017-01-17 MED ORDER — ACETAMINOPHEN 325 MG PO TABS
650.0000 mg | ORAL_TABLET | ORAL | Status: DC | PRN
  Administered 2017-01-18: 650 mg via ORAL
  Filled 2017-01-17: qty 2

## 2017-01-17 MED ORDER — PHENYLEPHRINE 40 MCG/ML (10ML) SYRINGE FOR IV PUSH (FOR BLOOD PRESSURE SUPPORT)
PREFILLED_SYRINGE | INTRAVENOUS | Status: AC
  Filled 2017-01-17: qty 10

## 2017-01-17 MED ORDER — ALUM & MAG HYDROXIDE-SIMETH 200-200-20 MG/5ML PO SUSP: 30.0000 mL | Freq: Four times a day (QID) | ORAL | Status: DC | PRN

## 2017-01-17 MED ORDER — PHENYLEPHRINE HCL 10 MG/ML IJ SOLN
INTRAMUSCULAR | Status: DC | PRN
  Administered 2017-01-17: 120 ug via INTRAVENOUS
  Administered 2017-01-17: 80 ug via INTRAVENOUS
  Administered 2017-01-17 (×3): 120 ug via INTRAVENOUS

## 2017-01-17 SURGICAL SUPPLY — 72 items
BAG DECANTER FOR FLEXI CONT (MISCELLANEOUS) ×3 IMPLANT
BENZOIN TINCTURE PRP APPL 2/3 (GAUZE/BANDAGES/DRESSINGS) ×3 IMPLANT
BIT DRILL 13 (BIT) ×2 IMPLANT
BIT DRILL 13MM (BIT) ×1
BONE VIVIGEN FORMABLE 1.3CC (Bone Implant) ×6 IMPLANT
BUR MATCHSTICK NEURO 3.0 LAGG (BURR) ×3 IMPLANT
CANISTER SUCT 3000ML PPV (MISCELLANEOUS) ×3 IMPLANT
CARTRIDGE OIL MAESTRO DRILL (MISCELLANEOUS) ×1 IMPLANT
CLOSURE STERI-STRIP 1/2X4 (GAUZE/BANDAGES/DRESSINGS) ×1
CLOSURE WOUND 1/2 X4 (GAUZE/BANDAGES/DRESSINGS) ×1
CLSR STERI-STRIP ANTIMIC 1/2X4 (GAUZE/BANDAGES/DRESSINGS) ×2 IMPLANT
DECANTER SPIKE VIAL GLASS SM (MISCELLANEOUS) ×3 IMPLANT
DERMABOND ADVANCED (GAUZE/BANDAGES/DRESSINGS) ×2
DERMABOND ADVANCED .7 DNX12 (GAUZE/BANDAGES/DRESSINGS) ×1 IMPLANT
DIFFUSER DRILL AIR PNEUMATIC (MISCELLANEOUS) ×3 IMPLANT
DRAIN SNY WOU 7FLT (WOUND CARE) ×3 IMPLANT
DRAPE C-ARM 42X72 X-RAY (DRAPES) ×6 IMPLANT
DRAPE LAPAROTOMY 100X72 PEDS (DRAPES) ×3 IMPLANT
DRAPE MICROSCOPE LEICA (MISCELLANEOUS) ×3 IMPLANT
DRAPE POUCH INSTRU U-SHP 10X18 (DRAPES) ×3 IMPLANT
DRSG OPSITE 4X5.5 SM (GAUZE/BANDAGES/DRESSINGS) ×3 IMPLANT
DRSG OPSITE POSTOP 4X6 (GAUZE/BANDAGES/DRESSINGS) ×3 IMPLANT
DURAPREP 6ML APPLICATOR 50/CS (WOUND CARE) ×3 IMPLANT
ELECT COATED BLADE 2.86 ST (ELECTRODE) ×3 IMPLANT
ELECT REM PT RETURN 9FT ADLT (ELECTROSURGICAL) ×3
ELECTRODE REM PT RTRN 9FT ADLT (ELECTROSURGICAL) ×1 IMPLANT
EVACUATOR SILICONE 100CC (DRAIN) ×3 IMPLANT
GAUZE SPONGE 4X4 12PLY STRL (GAUZE/BANDAGES/DRESSINGS) ×3 IMPLANT
GAUZE SPONGE 4X4 16PLY XRAY LF (GAUZE/BANDAGES/DRESSINGS) IMPLANT
GLOVE BIO SURGEON STRL SZ7 (GLOVE) ×3 IMPLANT
GLOVE BIO SURGEON STRL SZ8 (GLOVE) ×3 IMPLANT
GLOVE BIOGEL PI IND STRL 7.0 (GLOVE) ×2 IMPLANT
GLOVE BIOGEL PI IND STRL 7.5 (GLOVE) ×1 IMPLANT
GLOVE BIOGEL PI INDICATOR 7.0 (GLOVE) ×4
GLOVE BIOGEL PI INDICATOR 7.5 (GLOVE) ×2
GLOVE ECLIPSE 7.5 STRL STRAW (GLOVE) ×6 IMPLANT
GLOVE EXAM NITRILE LRG STRL (GLOVE) IMPLANT
GLOVE EXAM NITRILE XL STR (GLOVE) IMPLANT
GLOVE EXAM NITRILE XS STR PU (GLOVE) IMPLANT
GLOVE INDICATOR 8.5 STRL (GLOVE) ×3 IMPLANT
GLOVE SURG SS PI 6.0 STRL IVOR (GLOVE) ×6 IMPLANT
GOWN STRL REUS W/ TWL LRG LVL3 (GOWN DISPOSABLE) ×4 IMPLANT
GOWN STRL REUS W/ TWL XL LVL3 (GOWN DISPOSABLE) ×2 IMPLANT
GOWN STRL REUS W/TWL 2XL LVL3 (GOWN DISPOSABLE) IMPLANT
GOWN STRL REUS W/TWL LRG LVL3 (GOWN DISPOSABLE) ×8
GOWN STRL REUS W/TWL XL LVL3 (GOWN DISPOSABLE) ×4
HALTER HD/CHIN CERV TRACTION D (MISCELLANEOUS) ×3 IMPLANT
HEMOSTAT POWDER KIT SURGIFOAM (HEMOSTASIS) ×3 IMPLANT
KIT BASIN OR (CUSTOM PROCEDURE TRAY) ×3 IMPLANT
KIT ROOM TURNOVER OR (KITS) ×3 IMPLANT
NEEDLE SPNL 20GX3.5 QUINCKE YW (NEEDLE) ×3 IMPLANT
NS IRRIG 1000ML POUR BTL (IV SOLUTION) ×6 IMPLANT
OIL CARTRIDGE MAESTRO DRILL (MISCELLANEOUS) ×3
PACK LAMINECTOMY NEURO (CUSTOM PROCEDURE TRAY) ×3 IMPLANT
PIN DISTRACTION 14MM (PIN) ×6 IMPLANT
PLATE 3 60XNS SPNE CVD ANT T (Plate) ×1 IMPLANT
PLATE 3 ATLANTIS TRANS (Plate) ×2 IMPLANT
RUBBERBAND STERILE (MISCELLANEOUS) ×6 IMPLANT
SCREW ST 15X4XST FXANG NS (Screw) ×8 IMPLANT
SCREW ST FIX 4 ATL (Screw) ×16 IMPLANT
SPACER CERVICAL 11X14MM 7MM 0D (Spacer) ×6 IMPLANT
SPACER CERVICAL 11X14MM 8MM 0D (Spacer) ×3 IMPLANT
SPONGE INTESTINAL PEANUT (DISPOSABLE) ×3 IMPLANT
SPONGE SURGIFOAM ABS GEL 100 (HEMOSTASIS) ×3 IMPLANT
STRIP CLOSURE SKIN 1/2X4 (GAUZE/BANDAGES/DRESSINGS) ×2 IMPLANT
SUT VIC AB 3-0 SH 8-18 (SUTURE) ×3 IMPLANT
SUT VIC AB 4-0 PS2 27 (SUTURE) ×3 IMPLANT
TAPE CLOTH 4X10 WHT NS (GAUZE/BANDAGES/DRESSINGS) ×3 IMPLANT
TOWEL GREEN STERILE (TOWEL DISPOSABLE) ×3 IMPLANT
TOWEL GREEN STERILE FF (TOWEL DISPOSABLE) IMPLANT
TRAP SPECIMEN MUCOUS 40CC (MISCELLANEOUS) IMPLANT
WATER STERILE IRR 1000ML POUR (IV SOLUTION) ×3 IMPLANT

## 2017-01-17 NOTE — Transfer of Care (Signed)
Immediate Anesthesia Transfer of Care Note  Patient: ELIAH MARQUARD  Procedure(s) Performed: ACDF - Cervical three-Cervical four - Cervical four-Cervical five - Cervical five-Cervical six (N/A )  Patient Location: PACU  Anesthesia Type:General  Level of Consciousness: awake, alert , oriented and sedated  Airway & Oxygen Therapy: Patient Spontanous Breathing and Patient connected to face mask oxygen  Post-op Assessment: Report given to RN, Post -op Vital signs reviewed and stable and Patient moving all extremities  Post vital signs: Reviewed and stable  Last Vitals:  Vitals:   01/17/17 0821 01/17/17 1417  BP: (!) 163/98 (!) 153/92  Pulse:  100  Resp:  (!) 8  Temp:  36.8 C  SpO2:  97%    Last Pain:  Vitals:   01/17/17 1417  TempSrc:   PainSc: (P) 0-No pain      Patients Stated Pain Goal: 2 (00/92/33 0076)  Complications: No apparent anesthesia complications

## 2017-01-17 NOTE — Progress Notes (Signed)
Orthopedic Tech Progress Note Patient Details:  Alex Torres 01/03/1956 [id number]  Ortho Devices Type of Ortho Device: Soft collar Ortho Device/Splint Location: neck Ortho Device/Splint Interventions: Criss Alvine 01/17/2017, 7:25 PM

## 2017-01-17 NOTE — Op Note (Signed)
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  Postoperative diagnosis: Same  Procedure: Anterior cervical discectomies and fusion at C3-4, C4-5, C5-6. Utilizing allograft wedges packed with the vivigen and the Atlantis translational plating system with a 2:15 millimeter fixed angle screws  Surgeon: Dominica Severin Glade Strausser  Asst.: Fransico Meadow  Anesthesia: Gen.  EBL: Minimal  History of present illness: Patient is very pleasant 61 year old some is a progress worsening neck pain bilateral arm pain numbness tingling weakness in his hands. Workup revealed severe spinal stenosis cord compression at C3-4 C4-5 and C5-6 with signal change within his cord behind the C3-4 disc space. Due to patient's failure conservative treatment imaging findings and progression of clinical syndrome I recommended anterior cervical discectomies and fusion at all 3 of those levels. I extensively went over the risks and benefits of the operation with the patient as well as perioperative course expectations of outcome and alternatives of surgery and he understood and agreed to proceed forward.  Operative procedure: Patient brought into the or was induced under general anesthesia positioned supine the neck in slight extension in 5 pounds of halter traction. The right side of his neck was prepped and draped in routine sterile fashion. Interoperative x-ray confirmed medication appropriate level so a curvilinear incision was made just off midline to the interbody the sternomastoid and superficially of the toes and was dissected and divided longitudinally. The avascular plane to sternomastoid and strap muscles was drilled down the previously fashioned professes acid with Kitners. Interoperative x-ray confirmed medication C4-5 disc space level so this was marked lungs close reflected laterally at all 3 disc spaces and self-retaining  retractors are placed. All 3 disc spaces were incised anterior aspect of did not Leksell rongeur and 3 mm Kerrison punch. All 3 disc spaces were scraped and drilled down the posterior annulus and posterior complex. Then under Mike's cup illumination first working at C3-4 aggressive abutting both endplates removed the posterior spur there was a lot of soft disc herniation causing severe cord compression this is all teased with a blunt nerve hook and removed in piecemeal fashion. Marking laterally both C4 pedicles were identified both C4 nerve roots decompressed there was several additional large piece of disc fragment in the foramen these were all teased away decompressing both C4 nerve roots. After aggressively under biting remove the spur of the posterior aspect of the C3 and C4 for vertebral bodies to space was widely decompressed. This is packed with Gelfoam testing the C5-6. At C5-6 and similar fashion disc space is drilled and there was a large central disc herniation this is all removed decompress the central canal both C6 pedicles were identified both C6 nerve roots, social pedicle. Again aggressively under biting both endplates decompress the canal. This was packed with Gelfoam to second C4-5. C4-5 pathology was mostly soft disc herniation causing severe cord compression this is all removed in piecemeal fashion with black nerve root and aggressively under biting both endplates decompress the central canal. After adequate decompression achieved and meticulous hemostasis been achieved, a 7 mm allograft packed with the vivigen was placed at C3-4 and C4-5 with an 8 mm allograft packed was placed at C5-6. Then a 60 mm Atlantis translational plate was placed all screws excellent purchase locking mechanism was engaged and was a go see her good fixing space was maintained a J-P drain was placed and the wounds closed in layers with after Vicryl skin was closed  running 4 subcuticular Dermabond benzo and Steri-Strips  and a sterile dressing was applied patient recovered in stable condition. At the end the case on it counts sponge counts were correct.

## 2017-01-17 NOTE — Anesthesia Procedure Notes (Signed)
Procedure Name: Intubation Date/Time: 01/17/2017 10:47 AM Performed by: Scheryl Darter, CRNA Pre-anesthesia Checklist: Patient identified, Emergency Drugs available, Suction available and Patient being monitored Patient Re-evaluated:Patient Re-evaluated prior to induction Oxygen Delivery Method: Circle System Utilized Preoxygenation: Pre-oxygenation with 100% oxygen Induction Type: IV induction Ventilation: Mask ventilation without difficulty Tube type: Oral Tube size: 7.5 mm Number of attempts: 1 Airway Equipment and Method: Stylet and Oral airway Placement Confirmation: ETT inserted through vocal cords under direct vision,  positive ETCO2 and breath sounds checked- equal and bilateral Secured at: 23 cm Tube secured with: Tape Dental Injury: Teeth and Oropharynx as per pre-operative assessment  Difficulty Due To: Difficulty was anticipated, Difficult Airway- due to anterior larynx, Difficult Airway- due to dentition, Difficult Airway- due to limited oral opening and Difficult Airway- due to reduced neck mobility

## 2017-01-17 NOTE — Anesthesia Preprocedure Evaluation (Addendum)
Anesthesia Evaluation  Patient identified by MRN, date of birth, ID band Patient awake    Reviewed: Allergy & Precautions, NPO status , Patient's Chart, lab work & pertinent test results  Airway Mallampati: II  TM Distance: >3 FB Neck ROM: Full    Dental  (+) Dental Advisory Given   Pulmonary former smoker,    breath sounds clear to auscultation       Cardiovascular hypertension, Pt. on medications  Rhythm:Regular Rate:Normal     Neuro/Psych negative neurological ROS     GI/Hepatic negative GI ROS, Neg liver ROS,   Endo/Other  negative endocrine ROS  Renal/GU negative Renal ROS     Musculoskeletal   Abdominal   Peds  Hematology negative hematology ROS (+)   Anesthesia Other Findings   Reproductive/Obstetrics                            Lab Results  Component Value Date   WBC 11.7 (H) 01/16/2017   HGB 18.4 (H) 01/16/2017   HCT 54.3 (H) 01/16/2017   MCV 99.5 01/16/2017   PLT 224 01/16/2017   Lab Results  Component Value Date   CREATININE 0.82 01/16/2017   BUN 19 01/16/2017   NA 137 01/16/2017   K 4.4 01/16/2017   CL 105 01/16/2017   CO2 24 01/16/2017    Anesthesia Physical Anesthesia Plan  ASA: II  Anesthesia Plan: General   Post-op Pain Management:    Induction: Intravenous  PONV Risk Score and Plan: 2 and Ondansetron, Dexamethasone and Treatment may vary due to age or medical condition  Airway Management Planned: Oral ETT and Video Laryngoscope Planned  Additional Equipment:   Intra-op Plan:   Post-operative Plan: Extubation in OR  Informed Consent: I have reviewed the patients History and Physical, chart, labs and discussed the procedure including the risks, benefits and alternatives for the proposed anesthesia with the patient or authorized representative who has indicated his/her understanding and acceptance.   Dental advisory given  Plan Discussed with:  CRNA  Anesthesia Plan Comments:       Anesthesia Quick Evaluation
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
## 2017-01-18 MED ORDER — PNEUMOCOCCAL VAC POLYVALENT 25 MCG/0.5ML IJ INJ
0.5000 mL | INJECTION | INTRAMUSCULAR | Status: AC
Start: 2017-01-18 — End: 2017-01-18
  Administered 2017-01-18: 0.5 mL via INTRAMUSCULAR
  Filled 2017-01-18: qty 0.5

## 2017-01-18 MED ORDER — OXYCODONE HCL 10 MG PO TABS: 10.0000 mg | ORAL_TABLET | Freq: Four times a day (QID) | ORAL | 0 refills | Status: DC | PRN

## 2017-01-18 NOTE — Discharge Instructions (Signed)

## 2017-01-18 NOTE — Evaluation (Signed)
Physical Therapy Evaluation Patient Details Name: Alex Torres MRN: [id number] DOB: September 30, 1955 Today's Date: 01/18/2017   History of Present Illness  Pt is a 61 y.o. male s/p C3-6 ACDF. PMHx: Depression, Dysrhythmia, HTN.  Clinical Impression  Pt presented sitting OOB in recliner chair, awake and willing to participate in therapy session. Prior to admission, pt reported that he was independent and works in [employer]. Pt very unsteady with ambulation without use of an AD but no overt LOB or need for physical assistance. Pt is planning to stay with his son upon d/c who will be able to provide supervision/assistance intermittently. Pt would continue to benefit from skilled physical therapy services at this time while admitted and after d/c to address the below listed limitations in order to improve overall safety and independence with functional mobility.     Follow Up Recommendations Supervision/Assistance - 24 hour;Other (comment)(OP PT once cleared by MD)    Equipment Recommendations  Rolling walker with 5" wheels    Recommendations for Other Services       Precautions / Restrictions Precautions Precautions: Cervical;Fall Precaution Comments: Educated pt on cervical precautions Required Braces or Orthoses: Cervical Brace Cervical Brace: Soft collar Restrictions Weight Bearing Restrictions: No      Mobility  Bed Mobility Overal bed mobility: Needs Assistance Bed Mobility: Rolling;Sidelying to Sit Rolling: Supervision Sidelying to sit: Supervision       General bed mobility comments: pt OOB upon arrival  Transfers Overall transfer level: Needs assistance Equipment used: None Transfers: Sit to/from Stand Sit to Stand: Supervision         General transfer comment: for safety  Ambulation/Gait Ambulation/Gait assistance: Min guard Ambulation Distance (Feet): 75 Feet Assistive device: None Gait Pattern/deviations: Step-through pattern;Decreased step length -  right;Decreased step length - left;Decreased stride length;Trunk flexed Gait velocity: decreased Gait velocity interpretation: Below normal speed for age/gender General Gait Details: modest instability but no overt LOB or need for physical assistance, close min guard for safety without use of an AD but occasionally reaching for support surfaces with UEs  Stairs            Wheelchair Mobility    Modified Rankin (Stroke Patients Only)       Balance Overall balance assessment: Needs assistance Sitting-balance support: Feet supported;No upper extremity supported Sitting balance-Leahy Scale: Good     Standing balance support: No upper extremity supported;During functional activity Standing balance-Leahy Scale: Fair Standing balance comment: for static standing                             Pertinent Vitals/Pain Pain Assessment: Faces Faces Pain Scale: Hurts even more Pain Location: neck Pain Descriptors / Indicators: Aching;Grimacing Pain Intervention(s): Monitored during session;Repositioned    Home Living Family/patient expects to be discharged to:: Private residence Living Arrangements: Alone Available Help at Discharge: Family;Available PRN/intermittently Type of Home: Mobile home Home Access: Stairs to enter Entrance Stairs-Rails: Psychiatric nurse of Steps: "a couple" Home Layout: One level Home Equipment: None Additional Comments: Plan is to stay at sons house initially    Prior Function Level of Independence: Independent         Comments: was working in [place] Hand: Right    Extremity/Trunk Assessment   Upper Extremity Assessment Upper Extremity Assessment: Defer to OT evaluation    Lower Extremity Assessment Lower Extremity Assessment: Generalized weakness    Cervical / Trunk Assessment Cervical /  Trunk Assessment: Other exceptions Cervical / Trunk Exceptions: s/p ACDF   Communication   Communication: No difficulties  Cognition Arousal/Alertness: Awake/alert Behavior During Therapy: Flat affect Overall Cognitive Status: Within Functional Limits for tasks assessed                                        General Comments      Exercises     Assessment/Plan    PT Assessment Patient needs continued PT services  PT Problem List Decreased strength;Decreased balance;Decreased mobility;Decreased coordination;Decreased safety awareness;Decreased knowledge of precautions;Pain       PT Treatment Interventions DME instruction;Stair training;Gait training;Functional mobility training;Therapeutic activities;Therapeutic exercise;Balance training;Neuromuscular re-education;Patient/family education    PT Goals (Current goals can be found in the Care Plan section)  Acute Rehab PT Goals Patient Stated Goal: regain strength PT Goal Formulation: With patient Time For Goal Achievement: 02/01/17 Potential to Achieve Goals: Good    Frequency Min 5X/week   Barriers to discharge        Co-evaluation               AM-PAC PT "6 Clicks" Daily Activity  Outcome Measure Difficulty turning over in bed (including adjusting bedclothes, sheets and blankets)?: A Lot Difficulty moving from lying on back to sitting on the side of the bed? : A Lot Difficulty sitting down on and standing up from a chair with arms (e.g., wheelchair, bedside commode, etc,.)?: A Lot Help needed moving to and from a bed to chair (including a wheelchair)?: A Little Help needed walking in hospital room?: A Little Help needed climbing 3-5 steps with a railing? : A Lot 6 Click Score: 14    End of Session Equipment Utilized During Treatment: Cervical collar Activity Tolerance: Patient tolerated treatment well Patient left: in chair;with call bell/phone within reach Nurse Communication: Mobility status PT Visit Diagnosis: Other abnormalities of gait and mobility  (R26.89);Pain Pain - part of body: (neck)    Time: 7371-0626 PT Time Calculation (min) (ACUTE ONLY): 19 min   Charges:   PT Evaluation $PT Eval Moderate Complexity: 1 Mod     PT G Codes:        [place], PT, DPT [place] 01/18/2017, 10:14 AM

## 2017-01-18 NOTE — Progress Notes (Signed)
Patient alert and oriented, mae's well, voiding adequate amount of urine, swallowing without difficulty,  c/o mild pain at time of discharge. Patient discharged home with family. Patient stated he does not need physical therapy to come to the house. " I can cope by myself" Script and discharged instructions given to patient. Patient and family stated understanding of instructions given. Patient has an appointment with Dr. Saintclair Halsted

## 2017-01-18 NOTE — Evaluation (Signed)
Occupational Therapy Evaluation Patient Details Name: Alex Torres MRN: [id number] DOB: May 28, 1955 Today's Date: 01/18/2017    History of Present Illness Pt is a 61 y.o. male s/p C3-6 ACDF. PMHx: Depression, Dysrhythmia, HTN.   Clinical Impression   Pt reports he was managing ADL independently PTA. Currently pt requires min guard assist overall for ADL and min assist for functional mobility. Provided pt with neck, safety, and ADL education. Pt presenting with bil UE weakness; recommend follow up with Outpatient OT once cleared by MD. Pt planning to d/c home with intermittent supervision from family. Pt would benefit from continued skilled OT to address established goals.    Follow Up Recommendations  Supervision - Intermittent (Outpatient OT once cleared by MD for UE weakness)    Equipment Recommendations  3 in 1 bedside commode(for shower chair)    Recommendations for Other Services PT consult     Precautions / Restrictions Precautions Precautions: Cervical;Fall Precaution Comments: Educated pt on cervical precautions Required Braces or Orthoses: Cervical Brace Cervical Brace: Soft collar Restrictions Weight Bearing Restrictions: No      Mobility Bed Mobility Overal bed mobility: Needs Assistance Bed Mobility: Rolling;Sidelying to Sit Rolling: Supervision Sidelying to sit: Supervision       General bed mobility comments: for safety. HOB flat with use of bed rails. Good log roll technqiue  Transfers Overall transfer level: Needs assistance Equipment used: None Transfers: Sit to/from Stand Sit to Stand: Min guard         General transfer comment: for safety and balance    Balance Overall balance assessment: Needs assistance Sitting-balance support: Feet supported;No upper extremity supported Sitting balance-Leahy Scale: Good     Standing balance support: No upper extremity supported;During functional activity Standing balance-Leahy Scale: Fair Standing  balance comment: for static standing                           ADL either performed or assessed with clinical judgement   ADL Overall ADL's : Needs assistance/impaired Eating/Feeding: Set up;Sitting   Grooming: Min guard;Standing Grooming Details (indicate cue type and reason): Educated on use of 2 cups for oral care Upper Body Bathing: Set up;Supervision/ safety;Sitting   Lower Body Bathing: Min guard;Sit to/from stand   Upper Body Dressing : Set up;Supervision/safety;Sitting Upper Body Dressing Details (indicate cue type and reason): Educated on cervical collar management Lower Body Dressing: Min guard;Sit to/from stand Lower Body Dressing Details (indicate cue type and reason): Pt able to cross foot over opposite knee. Min guard for balance with sit to stand. Pt able to don underwear and pants without physical assist. Toilet Transfer: Minimal assistance;Ambulation;Regular Glass blower/designer Details (indicate cue type and reason): HHA for balance. Pt able to stand at toilet to void         Functional mobility during ADLs: Minimal assistance(HHA for balance) General ADL Comments: Educated pt on maintaining cervical precautions during functional activities, log roll technique for bed mobility, frequent mobility thorughout the day upon return home.     Vision         Perception     Praxis      Pertinent Vitals/Pain Pain Assessment: Faces Faces Pain Scale: Hurts even more Pain Location: neck Pain Descriptors / Indicators: Aching;Grimacing Pain Intervention(s): Monitored during session;Limited activity within patient's tolerance;Repositioned     Hand Dominance Right   Extremity/Trunk Assessment Upper Extremity Assessment Upper Extremity Assessment: Generalized weakness(shoulder FF to 90)   Lower Extremity Assessment Lower Extremity  Assessment: Defer to PT evaluation   Cervical / Trunk Assessment Cervical / Trunk Assessment: Other exceptions Cervical  / Trunk Exceptions: s/p ACDF   Communication Communication Communication: No difficulties   Cognition Arousal/Alertness: Lethargic;Suspect due to medications Behavior During Therapy: Flat affect Overall Cognitive Status: Within Functional Limits for tasks assessed                                     General Comments       Exercises     Shoulder Instructions      Home Living Family/patient expects to be discharged to:: Private residence Living Arrangements: Alone Available Help at Discharge: Family;Available PRN/intermittently Type of Home: Mobile home       Home Layout: One level     Bathroom Shower/Tub: Tub/shower unit;Curtain   Biochemist, clinical: Standard     Home Equipment: None   Additional Comments: Plan is to stay at sons house initially      Prior Functioning/Environment Level of Independence: Independent                 OT Problem List: Decreased strength;Decreased range of motion;Impaired balance (sitting and/or standing);Decreased safety awareness;Decreased knowledge of use of DME or AE;Impaired UE functional use;Pain      OT Treatment/Interventions: Self-care/ADL training;Energy conservation;DME and/or AE instruction;Therapeutic activities;Patient/family education;Balance training    OT Goals(Current goals can be found in the care plan section) Acute Rehab OT Goals Patient Stated Goal: get better and go home OT Goal Formulation: With patient Time For Goal Achievement: 02/01/17 Potential to Achieve Goals: Good ADL Goals Pt Will Perform Tub/Shower Transfer: with modified independence;Tub transfer;ambulating;3 in 1;rolling walker Additional ADL Goal #1: Pt will independently verbally recall cervical precautions and maintain throughout ADL.  OT Frequency: Min 2X/week   Barriers to D/C: Decreased caregiver support  pt lives alone       Co-evaluation              AM-PAC PT "6 Clicks" Daily Activity     Outcome Measure  Help from another person eating meals?: None Help from another person taking care of personal grooming?: A Little Help from another person toileting, which includes using toliet, bedpan, or urinal?: A Little Help from another person bathing (including washing, rinsing, drying)?: A Little Help from another person to put on and taking off regular upper body clothing?: A Little Help from another person to put on and taking off regular lower body clothing?: A Little 6 Click Score: 19   End of Session Equipment Utilized During Treatment: Cervical collar Nurse Communication: Mobility status;Other (comment)(f/u and equipment needs)  Activity Tolerance: Patient tolerated treatment well Patient left: in chair;with call bell/phone within reach  OT Visit Diagnosis: Unsteadiness on feet (R26.81);Other abnormalities of gait and mobility (R26.89)                Time: 5366-4403 OT Time Calculation (min): 23 min Charges:  OT General Charges $OT Visit: 1 Visit OT Evaluation $OT Eval Moderate Complexity: 1 Mod OT Treatments $Self Care/Home Management : 8-22 mins G-Codes:     Mohamad Bruso A. Ulice Brilliant, M.S., OTR/L Pager: [place] 01/18/2017, 9:46 AM

## 2017-01-18 NOTE — Anesthesia Postprocedure Evaluation (Signed)
Anesthesia Post Note  Patient: Alex Torres  Procedure(s) Performed: ACDF - Cervical three-Cervical four - Cervical four-Cervical five - Cervical five-Cervical six (N/A )     Patient location during evaluation: PACU Anesthesia Type: General Level of consciousness: awake and alert Pain management: pain level controlled Vital Signs Assessment: post-procedure vital signs reviewed and stable Respiratory status: spontaneous breathing, nonlabored ventilation, respiratory function stable and patient connected to nasal cannula oxygen Cardiovascular status: blood pressure returned to baseline and stable Postop Assessment: no apparent nausea or vomiting Anesthetic complications: no    Last Vitals:  Vitals:   01/18/17 1215 01/18/17 1216  BP: (!) 142/86 (!) 142/96  Pulse: 96 94  Resp: 20 20  Temp:  36.7 C  SpO2: 96% 96%    Last Pain:  Vitals:   01/18/17 1216  TempSrc: Oral  PainSc:                  Tiajuana Amass

## 2017-01-18 NOTE — Plan of Care (Signed)
  Progressing Safety: Ability to remain free from injury will improve 01/18/2017 0411 - Progressing by Jonnie Finner, RN Activity: Ability to avoid complications of mobility impairment will improve 01/18/2017 0411 - Progressing by Jonnie Finner, RN Ability to tolerate increased activity will improve 01/18/2017 0411 - Progressing by Jonnie Finner, RN Will remain free from falls 01/18/2017 0411 - Progressing by Jonnie Finner, RN Education: Ability to verbalize activity precautions or restrictions will improve 01/18/2017 0411 - Progressing by Jonnie Finner, RN Knowledge of the prescribed therapeutic regimen will improve 01/18/2017 0411 - Progressing by Jonnie Finner, RN Understanding of discharge needs will improve 01/18/2017 0411 - Progressing by Jonnie Finner, RN Physical Regulation: Ability to maintain clinical measurements within normal limits will improve 01/18/2017 0411 - Progressing by Jonnie Finner, RN Postoperative complications will be avoided or minimized 01/18/2017 0411 - Progressing by Jonnie Finner, RN Diagnostic test results will improve 01/18/2017 0411 - Progressing by Jonnie Finner, RN Pain Management: Pain level will decrease 01/18/2017 0411 - Progressing by Jonnie Finner, RN Skin Integrity: Signs of wound healing will improve 01/18/2017 0411 - Progressing by Jonnie Finner, RN Health Behavior/Discharge Planning: Identification of resources available to assist in meeting health care needs will improve 01/18/2017 0411 - Progressing by Jonnie Finner, RN

## 2017-01-18 NOTE — Discharge Summary (Signed)
  Physician Discharge Summary  Patient ID: Alex Torres MRN: [id number] DOB/AGE: 63/23/1957 63 y.o.  Admit date: 01/17/2017 Discharge date: 01/18/2017  Admission Diagnoses: Cervical spondylitic myelopathy  Discharge Diagnoses: Same Active Problems:   Myelopathy [facility])   Discharged Condition: good  Hospital Course: Patient admitted hospital underwent into cervical discectomies and fusion at C3-4, C4-5, C5-6. Postoperatively patient did very well recovered in the floor on the floor was angling and voiding still has significant balance issues and unsteadiness but no worse than baseline. Patient stable to be discharged home we will arrange for home health for skull therapy and a rolling walker.  Consults: Significant Diagnostic Studies: Treatments: Anterior cervical discectomies and fusion C3-4 C4-5 C5-6 Discharge Exam: Blood pressure (!) 157/92, pulse (!) 111, temperature (!) 97.4 F (36.3 C), temperature source Oral, resp. rate 20, height 5\' 8"  (1.727 m), weight 86.9 kg (191 lb 9.6 oz), SpO2 93 %. Strength 5 out of 5 still floridly myelopathic incision clean dry and intact  Disposition: Home  Discharge Instructions    Face-to-face encounter (required for Medicare/Medicaid patients)   Complete by:  As directed    I Brydon Spahr P certify that this patient is under my care and that I, or a nurse practitioner or physician's assistant working with me, had a face-to-face encounter that meets the physician face-to-face encounter requirements with this patient on 01/18/2017. The encounter with the patient was in whole, or in part for the following medical condition(s) which is the primary reason for home health care (List medical condition): Cervical myelopathy   The encounter with the patient was in whole, or in part, for the following medical condition, which is the primary reason for home health care:  Cervical myelopathy   I certify that, based on my findings, the following services are  medically necessary home health services:  Physical therapy   Reason for Medically Necessary Home Health Services:  Therapy- Instruction on Safe use of Assistive Devices for ADLs   My clinical findings support the need for the above services:  Unsafe ambulation due to balance issues   Further, I certify that my clinical findings support that this patient is homebound due to:  Unable to leave home safely without assistance   Home Health   Complete by:  As directed    To provide the following care/treatments:  PT   Walker rolling   Complete by:  As directed      Allergies as of 01/18/2017   No Known Allergies     Medication List    STOP taking these medications   ibuprofen 200 MG tablet Commonly known as:  ADVIL,MOTRIN   meloxicam 15 MG tablet Commonly known as:  MOBIC     TAKE these medications   lisinopril 10 MG tablet Commonly known as:  PRINIVIL,ZESTRIL Take 1 tablet (10 mg total) by mouth daily. What changed:  how much to take   Oxycodone HCl 10 MG Tabs Take 1 tablet (10 mg total) by mouth every 6 (six) hours as needed for severe pain ((score 7 to 10)).        Signed: Simon Aaberg P 01/18/2017, 9:02 AM

## 2017-01-22 ENCOUNTER — Encounter (HOSPITAL_COMMUNITY): Payer: Self-pay | Admitting: Neurosurgery

## 2017-01-23 ENCOUNTER — Other Ambulatory Visit: Payer: Self-pay | Admitting: Student

## 2017-01-23 DIAGNOSIS — M4802 Spinal stenosis, cervical region: Principal | ICD-10-CM

## 2017-01-23 DIAGNOSIS — G992 Myelopathy in diseases classified elsewhere: Secondary | ICD-10-CM

## 2017-01-25 ENCOUNTER — Ambulatory Visit: Payer: Commercial Managed Care - PPO

## 2017-01-31 ENCOUNTER — Ambulatory Visit: Payer: Commercial Managed Care - PPO

## 2017-02-16 ENCOUNTER — Ambulatory Visit: Payer: Commercial Managed Care - PPO

## 2017-03-19 ENCOUNTER — Ambulatory Visit: Payer: Commercial Managed Care - PPO

## 2017-03-24 ENCOUNTER — Ambulatory Visit: Payer: Commercial Managed Care - PPO

## 2017-03-28 ENCOUNTER — Ambulatory Visit
Admission: RE | Admit: 2017-03-28 | Discharge: 2017-03-28 | Disposition: A | Discharge status: Home / Self Care | Payer: Commercial Managed Care - PPO | Source: Ambulatory Visit | Attending: Student | Admitting: Student

## 2017-03-28 DIAGNOSIS — M4322 Fusion of spine, cervical region: Secondary | ICD-10-CM | POA: Insufficient documentation

## 2017-03-28 DIAGNOSIS — G992 Myelopathy in diseases classified elsewhere: Secondary | ICD-10-CM

## 2017-03-28 DIAGNOSIS — M4712 Other spondylosis with myelopathy, cervical region: Secondary | ICD-10-CM | POA: Diagnosis not present

## 2017-03-28 DIAGNOSIS — M4802 Spinal stenosis, cervical region: Secondary | ICD-10-CM

## 2017-03-28 LAB — POCT I-STAT CREATININE: Creatinine, Ser: 0.8 mg/dL (ref 0.61–1.24)

## 2017-03-28 MED ORDER — GADOBENATE DIMEGLUMINE 529 MG/ML IV SOLN
20.0000 mL | Freq: Once | INTRAVENOUS | Status: AC | PRN
  Administered 2017-03-28: 18 mL via INTRAVENOUS

## 2017-06-20 ENCOUNTER — Other Ambulatory Visit (HOSPITAL_COMMUNITY): Payer: Self-pay | Admitting: Neurosurgery

## 2017-06-20 DIAGNOSIS — M542 Cervicalgia: Secondary | ICD-10-CM

## 2017-06-22 ENCOUNTER — Ambulatory Visit (HOSPITAL_COMMUNITY): Payer: Commercial Managed Care - PPO

## 2017-06-28 ENCOUNTER — Ambulatory Visit (HOSPITAL_COMMUNITY)
Admission: RE | Admit: 2017-06-28 | Discharge: 2017-06-28 | Disposition: A | Discharge status: Home / Self Care | Payer: Commercial Managed Care - PPO | Source: Ambulatory Visit | Attending: Neurosurgery | Admitting: Neurosurgery

## 2017-06-28 DIAGNOSIS — M4802 Spinal stenosis, cervical region: Secondary | ICD-10-CM | POA: Diagnosis not present

## 2017-06-28 DIAGNOSIS — M542 Cervicalgia: Secondary | ICD-10-CM | POA: Diagnosis not present

## 2017-09-06 ENCOUNTER — Other Ambulatory Visit: Payer: Self-pay | Admitting: Neurosurgery

## 2017-09-16 ENCOUNTER — Other Ambulatory Visit: Payer: Self-pay

## 2017-09-16 ENCOUNTER — Encounter (HOSPITAL_BASED_OUTPATIENT_CLINIC_OR_DEPARTMENT_OTHER): Payer: Self-pay | Admitting: Emergency Medicine

## 2017-09-16 ENCOUNTER — Inpatient Hospital Stay (HOSPITAL_COMMUNITY): Payer: Self-pay

## 2017-09-16 ENCOUNTER — Emergency Department (HOSPITAL_BASED_OUTPATIENT_CLINIC_OR_DEPARTMENT_OTHER): Payer: Self-pay

## 2017-09-16 ENCOUNTER — Inpatient Hospital Stay (HOSPITAL_BASED_OUTPATIENT_CLINIC_OR_DEPARTMENT_OTHER)
Admission: EM | Admit: 2017-09-16 | Discharge: 2017-09-29 | DRG: 438 | Disposition: A | Discharge status: Skilled Nursing Facility | Payer: Self-pay | Attending: Family Medicine | Admitting: Family Medicine

## 2017-09-16 DIAGNOSIS — D649 Anemia, unspecified: Secondary | ICD-10-CM | POA: Diagnosis present

## 2017-09-16 DIAGNOSIS — F419 Anxiety disorder, unspecified: Secondary | ICD-10-CM | POA: Diagnosis present

## 2017-09-16 DIAGNOSIS — D509 Iron deficiency anemia, unspecified: Secondary | ICD-10-CM | POA: Diagnosis present

## 2017-09-16 DIAGNOSIS — E861 Hypovolemia: Secondary | ICD-10-CM | POA: Diagnosis present

## 2017-09-16 DIAGNOSIS — E876 Hypokalemia: Secondary | ICD-10-CM | POA: Diagnosis present

## 2017-09-16 DIAGNOSIS — G4733 Obstructive sleep apnea (adult) (pediatric): Secondary | ICD-10-CM | POA: Diagnosis present

## 2017-09-16 DIAGNOSIS — K838 Other specified diseases of biliary tract: Secondary | ICD-10-CM

## 2017-09-16 DIAGNOSIS — N179 Acute kidney failure, unspecified: Secondary | ICD-10-CM | POA: Diagnosis not present

## 2017-09-16 DIAGNOSIS — I501 Left ventricular failure: Secondary | ICD-10-CM | POA: Diagnosis present

## 2017-09-16 DIAGNOSIS — I468 Cardiac arrest due to other underlying condition: Secondary | ICD-10-CM | POA: Diagnosis not present

## 2017-09-16 DIAGNOSIS — E872 Acidosis: Secondary | ICD-10-CM | POA: Diagnosis present

## 2017-09-16 DIAGNOSIS — E860 Dehydration: Secondary | ICD-10-CM | POA: Diagnosis present

## 2017-09-16 DIAGNOSIS — D638 Anemia in other chronic diseases classified elsewhere: Secondary | ICD-10-CM | POA: Diagnosis present

## 2017-09-16 DIAGNOSIS — I2510 Atherosclerotic heart disease of native coronary artery without angina pectoris: Secondary | ICD-10-CM | POA: Diagnosis present

## 2017-09-16 DIAGNOSIS — R402313 Coma scale, best motor response, none, at hospital admission: Secondary | ICD-10-CM | POA: Diagnosis present

## 2017-09-16 DIAGNOSIS — F329 Major depressive disorder, single episode, unspecified: Secondary | ICD-10-CM | POA: Diagnosis present

## 2017-09-16 DIAGNOSIS — I272 Pulmonary hypertension, unspecified: Secondary | ICD-10-CM | POA: Diagnosis present

## 2017-09-16 DIAGNOSIS — K852 Alcohol induced acute pancreatitis without necrosis or infection: Principal | ICD-10-CM | POA: Diagnosis present

## 2017-09-16 DIAGNOSIS — R110 Nausea: Secondary | ICD-10-CM

## 2017-09-16 DIAGNOSIS — I110 Hypertensive heart disease with heart failure: Secondary | ICD-10-CM | POA: Diagnosis present

## 2017-09-16 DIAGNOSIS — I10 Essential (primary) hypertension: Secondary | ICD-10-CM

## 2017-09-16 DIAGNOSIS — Z9081 Acquired absence of spleen: Secondary | ICD-10-CM

## 2017-09-16 DIAGNOSIS — G9340 Encephalopathy, unspecified: Secondary | ICD-10-CM

## 2017-09-16 DIAGNOSIS — F10231 Alcohol dependence with withdrawal delirium: Secondary | ICD-10-CM | POA: Diagnosis not present

## 2017-09-16 DIAGNOSIS — R1084 Generalized abdominal pain: Secondary | ICD-10-CM

## 2017-09-16 DIAGNOSIS — J9602 Acute respiratory failure with hypercapnia: Secondary | ICD-10-CM | POA: Diagnosis not present

## 2017-09-16 DIAGNOSIS — J690 Pneumonitis due to inhalation of food and vomit: Secondary | ICD-10-CM | POA: Diagnosis not present

## 2017-09-16 DIAGNOSIS — G9341 Metabolic encephalopathy: Secondary | ICD-10-CM | POA: Diagnosis not present

## 2017-09-16 DIAGNOSIS — F5105 Insomnia due to other mental disorder: Secondary | ICD-10-CM | POA: Diagnosis present

## 2017-09-16 DIAGNOSIS — Z981 Arthrodesis status: Secondary | ICD-10-CM

## 2017-09-16 DIAGNOSIS — Z4659 Encounter for fitting and adjustment of other gastrointestinal appliance and device: Secondary | ICD-10-CM

## 2017-09-16 DIAGNOSIS — J9620 Acute and chronic respiratory failure, unspecified whether with hypoxia or hypercapnia: Secondary | ICD-10-CM

## 2017-09-16 DIAGNOSIS — Z978 Presence of other specified devices: Secondary | ICD-10-CM

## 2017-09-16 DIAGNOSIS — J9690 Respiratory failure, unspecified, unspecified whether with hypoxia or hypercapnia: Secondary | ICD-10-CM

## 2017-09-16 DIAGNOSIS — K5900 Constipation, unspecified: Secondary | ICD-10-CM | POA: Diagnosis present

## 2017-09-16 DIAGNOSIS — J9601 Acute respiratory failure with hypoxia: Secondary | ICD-10-CM

## 2017-09-16 DIAGNOSIS — Y95 Nosocomial condition: Secondary | ICD-10-CM | POA: Diagnosis present

## 2017-09-16 DIAGNOSIS — E871 Hypo-osmolality and hyponatremia: Secondary | ICD-10-CM | POA: Diagnosis present

## 2017-09-16 DIAGNOSIS — K8020 Calculus of gallbladder without cholecystitis without obstruction: Secondary | ICD-10-CM | POA: Diagnosis present

## 2017-09-16 DIAGNOSIS — J9811 Atelectasis: Secondary | ICD-10-CM | POA: Diagnosis not present

## 2017-09-16 DIAGNOSIS — K859 Acute pancreatitis without necrosis or infection, unspecified: Secondary | ICD-10-CM | POA: Diagnosis present

## 2017-09-16 DIAGNOSIS — J189 Pneumonia, unspecified organism: Secondary | ICD-10-CM

## 2017-09-16 DIAGNOSIS — R0902 Hypoxemia: Secondary | ICD-10-CM

## 2017-09-16 DIAGNOSIS — R402113 Coma scale, eyes open, never, at hospital admission: Secondary | ICD-10-CM | POA: Diagnosis present

## 2017-09-16 DIAGNOSIS — Z720 Tobacco use: Secondary | ICD-10-CM

## 2017-09-16 DIAGNOSIS — N400 Enlarged prostate without lower urinary tract symptoms: Secondary | ICD-10-CM | POA: Diagnosis present

## 2017-09-16 DIAGNOSIS — R402213 Coma scale, best verbal response, none, at hospital admission: Secondary | ICD-10-CM | POA: Diagnosis present

## 2017-09-16 DIAGNOSIS — Z79899 Other long term (current) drug therapy: Secondary | ICD-10-CM

## 2017-09-16 DIAGNOSIS — T426X5A Adverse effect of other antiepileptic and sedative-hypnotic drugs, initial encounter: Secondary | ICD-10-CM | POA: Diagnosis not present

## 2017-09-16 LAB — URINALYSIS, MICROSCOPIC (REFLEX)

## 2017-09-16 LAB — COMPREHENSIVE METABOLIC PANEL
ALBUMIN: 3.3 g/dL — AB (ref 3.5–5.0)
ALK PHOS: 64 U/L (ref 38–126)
ALT: 41 U/L (ref 0–44)
AST: 44 U/L — AB (ref 15–41)
Anion gap: 14 (ref 5–15)
BILIRUBIN TOTAL: 1.1 mg/dL (ref 0.3–1.2)
BUN: 24 mg/dL — AB (ref 8–23)
CO2: 20 mmol/L — AB (ref 22–32)
Calcium: 8.5 mg/dL — ABNORMAL LOW (ref 8.9–10.3)
Chloride: 93 mmol/L — ABNORMAL LOW (ref 98–111)
Creatinine, Ser: 1.35 mg/dL — ABNORMAL HIGH (ref 0.61–1.24)
GFR calc Af Amer: >60 mL/min (ref >60)
GFR calc non Af Amer: 54 mL/min — ABNORMAL LOW (ref >60)
GLUCOSE: 98 mg/dL (ref 70–99)
POTASSIUM: 3.5 mmol/L (ref 3.5–5.1)
Sodium: 127 mmol/L — ABNORMAL LOW (ref 135–145)
Total Protein: 7.7 g/dL (ref 6.5–8.1)

## 2017-09-16 LAB — URINALYSIS, ROUTINE W REFLEX MICROSCOPIC
GLUCOSE, UA: NEGATIVE mg/dL
KETONES UR: 15 mg/dL — AB
Leukocytes, UA: NEGATIVE
Nitrite: NEGATIVE
PH: 6 (ref 5.0–8.0)
Protein, ur: 30 mg/dL — AB
Specific Gravity, Urine: 1.02 (ref 1.005–1.030)

## 2017-09-16 LAB — DIFFERENTIAL
BASOS ABS: 0 10*3/uL (ref 0.0–0.1)
Basophils Relative: 0 %
EOS PCT: 0 %
Eosinophils Absolute: 0 10*3/uL (ref 0.0–0.7)
LYMPHS PCT: 4 %
Lymphs Abs: 1.3 10*3/uL (ref 0.7–4.0)
MONO ABS: 2.3 10*3/uL — AB (ref 0.1–1.0)
MONOS PCT: 7 %
Neutro Abs: 28.9 10*3/uL — ABNORMAL HIGH (ref 1.7–7.7)
Neutrophils Relative %: 89 %

## 2017-09-16 LAB — CBC
HCT: 39.2 % (ref 39.0–52.0)
HEMOGLOBIN: 12.9 g/dL — AB (ref 13.0–17.0)
MCH: 22.9 pg — AB (ref 26.0–34.0)
MCHC: 32.9 g/dL (ref 30.0–36.0)
MCV: 69.5 fL — AB (ref 78.0–100.0)
Platelets: 211 10*3/uL (ref 150–400)
RBC: 5.64 MIL/uL (ref 4.22–5.81)
RDW: 19.7 % — AB (ref 11.5–15.5)
WBC: 32.5 10*3/uL — ABNORMAL HIGH (ref 4.0–10.5)

## 2017-09-16 LAB — I-STAT CG4 LACTIC ACID, ED
Lactic Acid, Venous: 2.93 mmol/L (ref 0.5–1.9)
Lactic Acid, Venous: 1.59 mmol/L (ref 0.5–1.9)

## 2017-09-16 LAB — MRSA PCR SCREENING: MRSA BY PCR: NEGATIVE

## 2017-09-16 LAB — LIPASE, BLOOD: Lipase: 106 U/L — ABNORMAL HIGH (ref 11–51)

## 2017-09-16 LAB — CBG MONITORING, ED: Glucose-Capillary: 99 mg/dL (ref 70–99)

## 2017-09-16 MED ORDER — SODIUM CHLORIDE 0.9 % IV BOLUS
1000.0000 mL | Freq: Once | INTRAVENOUS | Status: AC
  Administered 2017-09-16: 1000 mL via INTRAVENOUS

## 2017-09-16 MED ORDER — MORPHINE SULFATE (PF) 4 MG/ML IV SOLN
4.0000 mg | Freq: Once | INTRAVENOUS | Status: AC
  Administered 2017-09-16: 4 mg via INTRAVENOUS
  Filled 2017-09-16: qty 1

## 2017-09-16 MED ORDER — LORAZEPAM 1 MG PO TABS: 1.0000 mg | ORAL_TABLET | Freq: Four times a day (QID) | ORAL | Status: DC | PRN

## 2017-09-16 MED ORDER — LACTATED RINGERS IV SOLN
INTRAVENOUS | Status: DC
  Administered 2017-09-16 – 2017-09-17 (×2): via INTRAVENOUS

## 2017-09-16 MED ORDER — LORAZEPAM 2 MG/ML IJ SOLN
1.0000 mg | Freq: Four times a day (QID) | INTRAMUSCULAR | Status: DC | PRN
  Administered 2017-09-17 (×4): 1 mg via INTRAVENOUS
  Filled 2017-09-16 (×4): qty 1

## 2017-09-16 MED ORDER — BISACODYL 10 MG RE SUPP
10.0000 mg | Freq: Every day | RECTAL | Status: DC | PRN
  Administered 2017-09-26: 10 mg via RECTAL
  Filled 2017-09-16: qty 1

## 2017-09-16 MED ORDER — THIAMINE HCL 100 MG/ML IJ SOLN
100.0000 mg | Freq: Every day | INTRAMUSCULAR | Status: DC
  Administered 2017-09-16 – 2017-09-21 (×6): 100 mg via INTRAVENOUS
  Filled 2017-09-16 (×6): qty 2

## 2017-09-16 MED ORDER — IOPAMIDOL (ISOVUE-300) INJECTION 61%
100.0000 mL | Freq: Once | INTRAVENOUS | Status: AC | PRN
  Administered 2017-09-16: 100 mL via INTRAVENOUS

## 2017-09-16 MED ORDER — ENOXAPARIN SODIUM 40 MG/0.4ML ~~LOC~~ SOLN: 40.0000 mg | SUBCUTANEOUS | Status: DC

## 2017-09-16 MED ORDER — ONDANSETRON HCL 4 MG/2ML IJ SOLN
4.0000 mg | Freq: Four times a day (QID) | INTRAMUSCULAR | Status: DC | PRN
Start: 2017-09-16 — End: 2017-09-24

## 2017-09-16 MED ORDER — MORPHINE SULFATE (PF) 2 MG/ML IV SOLN
1.0000 mg | INTRAVENOUS | Status: DC | PRN
  Administered 2017-09-16 – 2017-09-17 (×3): 1 mg via INTRAVENOUS
  Filled 2017-09-16 (×3): qty 1

## 2017-09-16 MED ORDER — ONDANSETRON HCL 4 MG PO TABS: 4.0000 mg | ORAL_TABLET | Freq: Four times a day (QID) | ORAL | Status: DC | PRN

## 2017-09-16 MED ORDER — HYDRALAZINE HCL 20 MG/ML IJ SOLN
5.0000 mg | INTRAMUSCULAR | Status: DC | PRN
  Administered 2017-09-16 – 2017-09-21 (×7): 5 mg via INTRAVENOUS
  Filled 2017-09-16 (×9): qty 1

## 2017-09-16 MED ORDER — FOLIC ACID 5 MG/ML IJ SOLN
1.0000 mg | Freq: Every day | INTRAMUSCULAR | Status: DC
  Administered 2017-09-16 – 2017-09-21 (×6): 1 mg via INTRAVENOUS
  Filled 2017-09-16 (×7): qty 0.2

## 2017-09-16 MED ORDER — HYDRALAZINE HCL 20 MG/ML IJ SOLN: 5.0000 mg | Freq: Four times a day (QID) | INTRAMUSCULAR | Status: DC | PRN

## 2017-09-16 MED ORDER — ACETAMINOPHEN 650 MG RE SUPP: 650.0000 mg | Freq: Four times a day (QID) | RECTAL | Status: DC | PRN

## 2017-09-16 MED ORDER — ORAL CARE MOUTH RINSE: 15.0000 mL | Freq: Two times a day (BID) | OROMUCOSAL | Status: DC

## 2017-09-16 MED ORDER — HYDRALAZINE HCL 20 MG/ML IJ SOLN
INTRAMUSCULAR | Status: AC
  Filled 2017-09-16: qty 1

## 2017-09-16 MED ORDER — PIPERACILLIN-TAZOBACTAM 3.375 G IVPB 30 MIN
3.3750 g | Freq: Once | INTRAVENOUS | Status: AC
  Administered 2017-09-16: 3.375 g via INTRAVENOUS
  Filled 2017-09-16 (×2): qty 50

## 2017-09-16 MED ORDER — CHLORHEXIDINE GLUCONATE 0.12 % MT SOLN
15.0000 mL | Freq: Two times a day (BID) | OROMUCOSAL | Status: DC
  Administered 2017-09-16 – 2017-09-17 (×2): 15 mL via OROMUCOSAL
  Filled 2017-09-16 (×2): qty 15

## 2017-09-16 MED ORDER — ACETAMINOPHEN 325 MG PO TABS: 650.0000 mg | ORAL_TABLET | Freq: Four times a day (QID) | ORAL | Status: DC | PRN

## 2017-09-16 NOTE — ED Notes (Signed)
Was called over to CT - patient unable to lay down flat because he became acutely SOB - the patient states that he feels less SOB on his side. CT completed  - 02 placed at 2 liters
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
## 2017-09-16 NOTE — Plan of Care (Addendum)
62 yo male medical history significant ex lap and splenectomy ( 1972), alcohol abuse who presented to me and see HPI on 7/28 with several days of nausea, abdominal pain, decreased urination, and fatigue.  He drinks 6 pack of beer a day per discussion with the ED physician.  In ED was afebrile, tachypneic, tachycardic to the 120s (sinus tachycardia on EKG).  Lab work notable for lactic acid 2.93, creatinine 1.35, sodium 127, lipase 106.    Found to have acute pancreatitis on CT abdomen.  Acute Pancreatitis, suspect alcohol induced -no necrotizing features  on CT imaging -Surgery consulted in ED (GB wall thickening with gas, no biliary ductal dilatation, no cholelithiasis, normal LFTs) , given zosyn in ED - Given IV antiemetics, IVF in ED  Lactic acidosis -Given IVF in ED  AKI, suspect prerenal -Creatinine 1.35 (poor PO intake) -Baseline within normal limits  Leukocytosis - Likely stress leukocytosis in setting of acute pancreatitis - UA does show bacteria, not typical in males, no overt urinary symptoms -blood cultures obtained in ED    Patient will be admitted for Acute pancreatitis, AKI, lactic acidosis  Shayla D Nettey [facility]

## 2017-09-16 NOTE — ED Triage Notes (Signed)
Generalized abdominal pain x 2 days. Denies N/V/D

## 2017-09-16 NOTE — ED Notes (Signed)
Patient transported to CT 
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
## 2017-09-16 NOTE — ED Notes (Signed)
ED Provider at bedside. 

## 2017-09-17 ENCOUNTER — Inpatient Hospital Stay (HOSPITAL_COMMUNITY): Payer: Self-pay

## 2017-09-17 DIAGNOSIS — I361 Nonrheumatic tricuspid (valve) insufficiency: Secondary | ICD-10-CM

## 2017-09-17 LAB — CBC
HCT: 36 % — ABNORMAL LOW (ref 39.0–52.0)
Hemoglobin: 11.2 g/dL — ABNORMAL LOW (ref 13.0–17.0)
MCH: 22.9 pg — ABNORMAL LOW (ref 26.0–34.0)
MCHC: 31.1 g/dL (ref 30.0–36.0)
MCV: 73.6 fL — ABNORMAL LOW (ref 78.0–100.0)
PLATELETS: 225 10*3/uL (ref 150–400)
RBC: 4.89 MIL/uL (ref 4.22–5.81)
RDW: 18.1 % — ABNORMAL HIGH (ref 11.5–15.5)
WBC: 29.7 10*3/uL — AB (ref 4.0–10.5)

## 2017-09-17 LAB — BLOOD GAS, ARTERIAL
Acid-Base Excess: 0.2 mmol/L (ref 0.0–2.0)
Bicarbonate: 27.9 mmol/L (ref 20.0–28.0)
Drawn by: 441261
O2 CONTENT: 2 L/min
O2 Saturation: 89.7 %
PCO2 ART: 64.3 mmHg — AB (ref 32.0–48.0)
PH ART: 7.26 — AB (ref 7.350–7.450)
PO2 ART: 67.9 mmHg — AB (ref 83.0–108.0)
Patient temperature: 98.6

## 2017-09-17 LAB — GLUCOSE, CAPILLARY
GLUCOSE-CAPILLARY: 66 mg/dL — AB (ref 70–99)
Glucose-Capillary: 104 mg/dL — ABNORMAL HIGH (ref 70–99)

## 2017-09-17 LAB — HIV ANTIBODY (ROUTINE TESTING W REFLEX): HIV SCREEN 4TH GENERATION: NONREACTIVE

## 2017-09-17 LAB — COMPREHENSIVE METABOLIC PANEL
ALK PHOS: 54 U/L (ref 38–126)
ALT: 32 U/L (ref 0–44)
AST: 32 U/L (ref 15–41)
Albumin: 2.8 g/dL — ABNORMAL LOW (ref 3.5–5.0)
Anion gap: 9 (ref 5–15)
BILIRUBIN TOTAL: 1 mg/dL (ref 0.3–1.2)
BUN: 14 mg/dL (ref 8–23)
CO2: 25 mmol/L (ref 22–32)
CREATININE: 0.86 mg/dL (ref 0.61–1.24)
Calcium: 8.3 mg/dL — ABNORMAL LOW (ref 8.9–10.3)
Chloride: 101 mmol/L (ref 98–111)
GFR calc Af Amer: >60 mL/min (ref >60)
Glucose, Bld: 68 mg/dL — ABNORMAL LOW (ref 70–99)
Potassium: 3.7 mmol/L (ref 3.5–5.1)
Sodium: 135 mmol/L (ref 135–145)
TOTAL PROTEIN: 6.7 g/dL (ref 6.5–8.1)

## 2017-09-17 LAB — URINE CULTURE: Culture: NO GROWTH

## 2017-09-17 LAB — LIPID PANEL
CHOLESTEROL: 95 mg/dL (ref 0–200)
HDL: 51 mg/dL (ref >40)
LDL CALC: 35 mg/dL (ref 0–99)
TRIGLYCERIDES: 43 mg/dL (ref <150)
Total CHOL/HDL Ratio: 1.9 RATIO
VLDL: 9 mg/dL (ref 0–40)

## 2017-09-17 LAB — LIPASE, BLOOD: Lipase: 40 U/L (ref 11–51)

## 2017-09-17 LAB — ECHOCARDIOGRAM COMPLETE
HEIGHTINCHES: 69 in
Weight: 3308.66 oz

## 2017-09-17 MED ORDER — IOPAMIDOL (ISOVUE-370) INJECTION 76%
100.0000 mL | Freq: Once | INTRAVENOUS | Status: AC | PRN
  Administered 2017-09-17: 100 mL via INTRAVENOUS

## 2017-09-17 MED ORDER — IOPAMIDOL (ISOVUE-370) INJECTION 76%
INTRAVENOUS | Status: AC
  Filled 2017-09-17: qty 100

## 2017-09-17 MED ORDER — ENOXAPARIN SODIUM 40 MG/0.4ML ~~LOC~~ SOLN
40.0000 mg | Freq: Every day | SUBCUTANEOUS | Status: DC
  Administered 2017-09-17 – 2017-09-29 (×13): 40 mg via SUBCUTANEOUS
  Filled 2017-09-17 (×13): qty 0.4

## 2017-09-17 MED ORDER — LEVALBUTEROL HCL 1.25 MG/0.5ML IN NEBU
1.2500 mg | INHALATION_SOLUTION | RESPIRATORY_TRACT | Status: DC | PRN
  Administered 2017-09-17: 1.25 mg via RESPIRATORY_TRACT
  Filled 2017-09-17: qty 0.5

## 2017-09-17 MED ORDER — FUROSEMIDE 10 MG/ML IJ SOLN
40.0000 mg | Freq: Once | INTRAMUSCULAR | Status: AC
  Administered 2017-09-17: 40 mg via INTRAVENOUS
  Filled 2017-09-17: qty 4

## 2017-09-17 MED ORDER — MORPHINE SULFATE (PF) 2 MG/ML IV SOLN
1.0000 mg | INTRAVENOUS | Status: DC | PRN
  Administered 2017-09-17 (×2): 1 mg via INTRAVENOUS
  Filled 2017-09-17 (×2): qty 1

## 2017-09-17 NOTE — Progress Notes (Signed)
PROGRESS NOTE [facility]   Alex Torres   VFI:[id number] DOB: 04-17-1955  DOA: 09/16/2017 PCP: Hayden Rasmussen, MD   Brief Narrative:  Alex Torres 62 year old male with medical history significant for hypertension and splenectomy (1972), alcohol abuse who presented to the emergency department complaining of abdominal pain associated with nausea, anorexia and fatigue.  Patient is a heavy drinker sixpack of beers per day.  Upon ED evaluation patient was found to be hypertensive, tachycardic, febrile with WBC of 30 2K.  CT A/P show acute pancreatitis gallbladder with gas and surrounding inflammatory changes.  Creatinine was 1.35 lipase 106 AST 44.  Patient was admitted with working diagnosis of acute pancreatitis complicated with acute renal injury.  Subjective: Patient seen and examined, today feeling slight better than yesterday, however he was placed on oxygen overnight.  Patient reports on shortness of breath, he is in 2 L nasal cannula and saturations are at 90 to 91%.  Patient also tachycardic with elevated CIWA score.  Assessment & Plan: Acute pancreatitis - improving Likely alcohol induced, CT scan consistent with acute pancreatitis, no evidence of necrotizing pancreatitis.  Surrounding inflammatory changes to the duodenum.  Also gallbladder wall thickening with gas in the anterior gallbladder questionable cholecystitis. RUQ Ultrasound shows cholelithiasis with no signs of cholecystitis.   Clinically improving, advance diet to clear liquids.  Continue IV hydration.  Lipase trending down, lipid panel within normal limits.  Continue supportive treatment.  Acute renal injury - resolved Suspect to be prerenal, dehydration. Holding lisinopril, creatinine significantly improved with IV fluids.  Continue to monitor renal function and avoid nephrotoxic agent and hypotension.  Leukocytosis Likely stress induced in setting of severe acute pancreatitis, WBC trending down . Patient was  given Zosyn in the ED.  Patient afebrile, Urine culture no growth. Will continue to hold abx for now. CXR no signs of infection   Alcohol abuse - some signs of alcohol withdrawal  However CIWA score this AM 0  Continue to monitor, Ativan per CIWA  Continue Thiamine and Folic acid   Acute respiratory failure with hypoxia  Unclear why, CXR normal, CTA no PE, however shows calcification of LAD with small pleural effusion. Will obtain ECHO. Decrease IVF. Wean O2 as able. Continue to monitor   Hyponatremia - Resolved  Due to hypovolemia, cont to monitor   HTN  BP ok, lisinopril on hold due to AKI  Continue hydralazine 5 mg every 4 hour as needed for SBP > 160 or DBP > 100. Monitor BP closely.   Lactic acidosis - Resolved   Enlarged prostate Patient report slow urine stream and difficulty initiating micturition. Will benefit from Flomax when able to tolerate p.o.  DVT prophylaxis: Lovenox  Code Status: Full Code  Family Communication: None at bedside  Disposition Plan: Home in 1-2 day when w/u is completed and tolerates diet   Consultants:   None   Procedures:   None   Antimicrobials:  None     Objective: Vitals:   09/17/17 0000 09/17/17 0400 09/17/17 0600 09/17/17 0730  BP: 105/68 114/67 (!) 130/98   Pulse: (!) 130     Resp: (!) 21 (!) 22 (!) 24   Temp: 98.1 F (36.7 C) 98.4 F (36.9 C)  99.9 F (37.7 C)  TempSrc: Oral Axillary  Oral  SpO2: 96% 94% (!) 87%   Weight:      Height:        Intake/Output Summary (Last 24 hours) at 09/17/2017 4166 Last data filed at  09/17/2017 0932 Gross per 24 hour  Intake 3125.4 ml  Output 2300 ml  Net 825.4 ml   Filed Weights   09/16/17 1218 09/16/17 1641  Weight: 90.7 kg (200 lb) 93.8 kg (206 lb 12.7 oz)    Examination:  General exam: Appears calm and comfortable  Respiratory system: Decrease BS bibasilar, No wheezing,crackle or rhonchi, [place] 2L  Cardiovascular system: S1 & S2 heard, RRR. No JVD, murmurs, rubs or  gallops Gastrointestinal system: Abdomen is nondistended, soft and nontender. No organomegaly or masses felt. Central nervous system: Alert and oriented. No focal neurological deficits. Extremities: No pedal edema.  Skin: No rashes, lesions or ulcers  Data Reviewed: I have personally reviewed following labs and imaging studies  CBC: Recent Labs  Lab 09/16/17 1234 09/17/17 0353  WBC 32.5* 29.7*  NEUTROABS 28.9*  --   HGB 12.9* 11.2*  HCT 39.2 36.0*  MCV 69.5* 73.6*  PLT 211 671   Basic Metabolic Panel: Recent Labs  Lab 09/16/17 1234 09/17/17 0353  NA 127* 135  K 3.5 3.7  CL 93* 101  CO2 20* 25  GLUCOSE 98 68*  BUN 24* 14  CREATININE 1.35* 0.86  CALCIUM 8.5* 8.3*   GFR: Estimated Creatinine Clearance: 99.4 mL/min (by C-G formula based on SCr of 0.86 mg/dL). Liver Function Tests: Recent Labs  Lab 09/16/17 1234 09/17/17 0353  AST 44* 32  ALT 41 32  ALKPHOS 64 54  BILITOT 1.1 1.0  PROT 7.7 6.7  ALBUMIN 3.3* 2.8*   Recent Labs  Lab 09/16/17 1234 09/17/17 0353  LIPASE 106* 40   No results for input(s): AMMONIA in the last 168 hours. Coagulation Profile: No results for input(s): INR, PROTIME in the last 168 hours. Cardiac Enzymes: No results for input(s): CKTOTAL, CKMB, CKMBINDEX, TROPONINI in the last 168 hours. BNP (last 3 results) No results for input(s): PROBNP in the last 8760 hours. HbA1C: No results for input(s): HGBA1C in the last 72 hours. CBG: Recent Labs  Lab 09/16/17 1232  GLUCAP 99   Lipid Profile: Recent Labs    09/17/17 0353  CHOL 95  HDL 51  LDLCALC 35  TRIG 43  CHOLHDL 1.9   Thyroid Function Tests: No results for input(s): TSH, T4TOTAL, FREET4, T3FREE, THYROIDAB in the last 72 hours. Anemia Panel: No results for input(s): VITAMINB12, FOLATE, FERRITIN, TIBC, IRON, RETICCTPCT in the last 72 hours. Sepsis Labs: Recent Labs  Lab 09/16/17 1241 09/16/17 1440  LATICACIDVEN 2.93* 1.59    Recent Results (from the past 240  hour(s))  MRSA PCR Screening     Status: None   Collection Time: 09/16/17  4:45 PM  Result Value Ref Range Status   MRSA by PCR NEGATIVE NEGATIVE Final    Comment:        The GeneXpert MRSA Assay (FDA approved for NASAL specimens only), is one component of a comprehensive MRSA colonization surveillance program. It is not intended to diagnose MRSA infection nor to guide or monitor treatment for MRSA infections. Performed at [facility], [place] [address], [place], [place] [address]       Radiology Studies: Ct Abdomen Pelvis W Contrast  Result Date: 09/16/2017 CLINICAL DATA:  Severe abdominal pain. Abdominal distension. Decreased flatus. Nausea vomiting. Leukocytosis. Lactic acidosis. EXAM: CT ABDOMEN AND PELVIS WITH CONTRAST TECHNIQUE: Multidetector CT imaging of the abdomen and pelvis was performed using the standard protocol following bolus administration of intravenous contrast. CONTRAST:  167mL ISOVUE-300 IOPAMIDOL (ISOVUE-300) INJECTION 61% COMPARISON:  None. FINDINGS: Lower chest:  Mild-to-moderate dependent right lung base atelectasis. Trace dependent right pleural effusion. Hepatobiliary: Normal liver size. No liver mass. Nondistended gallbladder. Mild diffuse gallbladder wall thickening. Haziness of the pericholecystic fat. Small amount of gas in the anterior gallbladder. No radiopaque cholelithiasis. No biliary ductal dilatation. CBD diameter 6 mm. Pancreas: There is diffuse peripancreatic fat stranding and ill-defined fluid suggesting acute pancreatitis. No discrete pancreatic mass. No pancreatic duct dilation. No measurable peripancreatic fluid collection. Parenchymal enhancement appears preserved in pancreas. Spleen: Status post splenectomy. Multiple clustered splenules between the pancreatic tail and left kidney (series 2/image 32). Adrenals/Urinary Tract: Normal adrenals. Subcentimeter hypodense posterior interpolar right renal cortical lesion, too small to  characterize, for which no follow-up is required. Otherwise normal kidneys, with no hydronephrosis. Borderline mild diffuse bladder wall thickening. Bladder mildly distended. Stomach/Bowel: Small hiatal hernia. Otherwise normal nondistended stomach. Wall thickening with surrounding fat stranding and ill-defined fluid throughout the second and third portions of the duodenum. Normal caliber small bowel with no small bowel wall thickening. Normal appendix. Diffuse colonic diverticulosis, most prominent in the sigmoid colon, with no large bowel wall thickening or significant pericolonic fat stranding. Vascular/Lymphatic: Mildly atherosclerotic nonaneurysmal abdominal aorta. Patent portal, splenic, hepatic and renal veins. No pathologically enlarged lymph nodes in the abdomen or pelvis. Reproductive: Mildly enlarged prostate with nonspecific internal prostatic calcifications. Other: No pneumoperitoneum, ascites or focal fluid collection. Musculoskeletal: No aggressive appearing focal osseous lesions. Small sclerotic lesions in right sacrum and left iliac bone, probably benign bone islands. Moderate thoracolumbar spondylosis. IMPRESSION: 1. Diffuse peripancreatic fat stranding and ill-defined fluid, compatible with acute pancreatitis. No definite evidence of necrotizing pancreatitis. No measurable peripancreatic fluid collections. 2. Wall thickening with surrounding inflammatory changes throughout the second and third portions of the duodenum, probably reactive to the suspected pancreatitis, versus less likely primary duodenitis. 3. Gallbladder wall thickening with gas in the anterior gallbladder, cannot exclude emphysematous cholecystitis. No radiopaque cholelithiasis. No biliary ductal dilatation. 4. Trace dependent right pleural effusion. 5. Small hiatal hernia. 6. Diffuse colonic diverticulosis. 7. Borderline mild diffuse bladder wall thickening, nonspecific, probably due to chronic bladder outlet obstruction by the  mildly enlarged prostate. 8.  Aortic Atherosclerosis (ICD10-I70.0). These results were called by telephone at the time of interpretation on 09/16/2017 at 2:05 pm to Dr. Marda Stalker , who verbally acknowledged these results. Electronically Signed   By: Ilona Sorrel M.D.   On: 09/16/2017 14:23   Dg Chest Port 1 View  Result Date: 09/17/2017 CLINICAL DATA:  Hypoxia. EXAM: PORTABLE CHEST 1 VIEW COMPARISON:  None. FINDINGS: No pneumothorax. Heart size is prominent but exaggerated by low volume portable technique. The hila and mediastinum are normal. Elevation the right hemidiaphragm is noted. Low lung volumes are identified. No suspicious infiltrates noted. IMPRESSION: 1. Elevation the right hemidiaphragm and low lung volumes. 2. No focal infiltrate.  No other acute abnormality. Electronically Signed   By: Dorise Bullion III M.D   On: 09/17/2017 08:17   US Abdomen Limited Ruq  Result Date: 09/16/2017 CLINICAL DATA:  Pancreatitis. EXAM: ULTRASOUND ABDOMEN LIMITED RIGHT UPPER QUADRANT COMPARISON:  None. FINDINGS: Gallbladder: Cholelithiasis. No wall thickening, pericholecystic fluid, or Murphy's sign. Common bile duct: Diameter: 4.9 mm Liver: Probable hepatic steatosis. Portal vein is patent on color Doppler imaging with normal direction of blood flow towards the liver. IMPRESSION: 1. Cholelithiasis without wall thickening, pericholecystic fluid, or Murphy's sign. Electronically Signed   By: Dorise Bullion III M.D   On: 09/16/2017 17:48      Scheduled Meds: . chlorhexidine  15 mL Mouth Rinse BID  . folic acid  1 mg Intravenous Daily  . mouth rinse  15 mL Mouth Rinse q12n4p  . thiamine injection  100 mg Intravenous Daily   Continuous Infusions: . lactated ringers Stopped (09/17/17 0833)     LOS: 1 day    Time spent: Total of 35 minutes spent with pt, greater than 50% of which was spent in discussion of  treatment, counseling and coordination of care   Chipper Oman, MD Pager: Text Page  via [email or web address]   If 7PM-7AM, please contact night-coverage [email or web address] 09/17/2017, 8:58 AM   Note - This record has been created using [employer]. Chart creation errors have been sought, but may not always have been located. Such creation errors do not reflect on the standard of medical care.

## 2017-09-17 NOTE — Progress Notes (Signed)
  Echocardiogram 2D Echocardiogram has been performed.  Alex Torres 09/17/2017, 2:01 PM

## 2017-09-17 NOTE — Progress Notes (Signed)
Patient with increased agitation. Patient continues to remove DreamStation mask off. Patient placed on 4L [place]. Patient oxygen saturation 92%. Will pass on to night shift RN.

## 2017-09-17 NOTE — Progress Notes (Signed)
MD notified of patient appearing lethargic and short of breath . ABG ordered as well as lasix. Dream station ordered and applied by RT. Will continue to monitor pt.

## 2017-09-17 NOTE — Progress Notes (Signed)
Pt. placed back on BiPAP at this time tolerated transition well.

## 2017-09-18 LAB — CBC WITH DIFFERENTIAL/PLATELET
Basophils Absolute: 0 10*3/uL (ref 0.0–0.1)
Basophils Relative: 0 %
Eosinophils Absolute: 0.1 10*3/uL (ref 0.0–0.7)
Eosinophils Relative: 0 %
HEMATOCRIT: 34.8 % — AB (ref 39.0–52.0)
HEMOGLOBIN: 10.3 g/dL — AB (ref 13.0–17.0)
LYMPHS ABS: 1 10*3/uL (ref 0.7–4.0)
LYMPHS PCT: 4 %
MCH: 22.8 pg — AB (ref 26.0–34.0)
MCHC: 29.6 g/dL — ABNORMAL LOW (ref 30.0–36.0)
MCV: 77 fL — ABNORMAL LOW (ref 78.0–100.0)
Monocytes Absolute: 3.7 10*3/uL — ABNORMAL HIGH (ref 0.1–1.0)
Monocytes Relative: 13 %
NEUTROS ABS: 23.1 10*3/uL — AB (ref 1.7–7.7)
NEUTROS PCT: 83 %
Platelets: 222 10*3/uL (ref 150–400)
RBC: 4.52 MIL/uL (ref 4.22–5.81)
RDW: 18 % — ABNORMAL HIGH (ref 11.5–15.5)
WBC: 27.9 10*3/uL — ABNORMAL HIGH (ref 4.0–10.5)

## 2017-09-18 LAB — BASIC METABOLIC PANEL
Anion gap: 7 (ref 5–15)
BUN: 11 mg/dL (ref 8–23)
CALCIUM: 8.3 mg/dL — AB (ref 8.9–10.3)
CO2: 31 mmol/L (ref 22–32)
CREATININE: 0.73 mg/dL (ref 0.61–1.24)
Chloride: 97 mmol/L — ABNORMAL LOW (ref 98–111)
GFR calc Af Amer: >60 mL/min (ref >60)
GFR calc non Af Amer: >60 mL/min (ref >60)
GLUCOSE: 113 mg/dL — AB (ref 70–99)
Potassium: 3.4 mmol/L — ABNORMAL LOW (ref 3.5–5.1)
Sodium: 135 mmol/L (ref 135–145)

## 2017-09-18 LAB — MAGNESIUM: Magnesium: 1.9 mg/dL (ref 1.7–2.4)

## 2017-09-18 MED ORDER — LORAZEPAM 1 MG PO TABS: 2.0000 mg | ORAL_TABLET | Freq: Four times a day (QID) | ORAL | Status: DC | PRN

## 2017-09-18 MED ORDER — LORAZEPAM 1 MG PO TABS: 1.0000 mg | ORAL_TABLET | Freq: Four times a day (QID) | ORAL | Status: DC | PRN

## 2017-09-18 MED ORDER — CHLORDIAZEPOXIDE HCL 10 MG PO CAPS: 10.0000 mg | ORAL_CAPSULE | Freq: Four times a day (QID) | ORAL | Status: DC

## 2017-09-18 MED ORDER — METOPROLOL TARTRATE 5 MG/5ML IV SOLN
10.0000 mg | Freq: Once | INTRAVENOUS | Status: AC
  Administered 2017-09-18: 10 mg via INTRAVENOUS
  Filled 2017-09-18: qty 10

## 2017-09-18 MED ORDER — CHLORDIAZEPOXIDE HCL 25 MG PO CAPS
25.0000 mg | ORAL_CAPSULE | Freq: Three times a day (TID) | ORAL | Status: DC
  Filled 2017-09-18: qty 1

## 2017-09-18 MED ORDER — SODIUM CHLORIDE 0.9 % IV SOLN
INTRAVENOUS | Status: DC
  Administered 2017-09-18 – 2017-09-26 (×6): via INTRAVENOUS

## 2017-09-18 MED ORDER — LORAZEPAM 2 MG/ML IJ SOLN
1.0000 mg | Freq: Four times a day (QID) | INTRAMUSCULAR | Status: DC | PRN
  Administered 2017-09-18: 1 mg via INTRAVENOUS
  Filled 2017-09-18 (×2): qty 1

## 2017-09-18 MED ORDER — CHLORDIAZEPOXIDE HCL 5 MG PO CAPS: 5.0000 mg | ORAL_CAPSULE | Freq: Four times a day (QID) | ORAL | Status: DC

## 2017-09-18 MED ORDER — LORAZEPAM 2 MG/ML IJ SOLN
2.0000 mg | Freq: Four times a day (QID) | INTRAMUSCULAR | Status: DC | PRN
  Administered 2017-09-18 (×2): 2 mg via INTRAVENOUS
  Filled 2017-09-18 (×2): qty 1

## 2017-09-18 MED ORDER — PIPERACILLIN-TAZOBACTAM 3.375 G IVPB
3.3750 g | Freq: Three times a day (TID) | INTRAVENOUS | Status: AC
  Administered 2017-09-18 – 2017-09-27 (×29): 3.375 g via INTRAVENOUS
  Filled 2017-09-18 (×29): qty 50

## 2017-09-18 MED ORDER — CHLORDIAZEPOXIDE HCL 25 MG PO CAPS
25.0000 mg | ORAL_CAPSULE | Freq: Four times a day (QID) | ORAL | Status: DC
  Administered 2017-09-18: 25 mg via ORAL
  Filled 2017-09-18 (×2): qty 1

## 2017-09-18 MED ORDER — LORAZEPAM 2 MG/ML IJ SOLN: 1.0000 mg | Freq: Four times a day (QID) | INTRAMUSCULAR | Status: DC | PRN

## 2017-09-18 MED ORDER — HALOPERIDOL LACTATE 5 MG/ML IJ SOLN
5.0000 mg | Freq: Once | INTRAMUSCULAR | Status: AC
  Administered 2017-09-18: 5 mg via INTRAVENOUS
  Filled 2017-09-18: qty 1

## 2017-09-18 MED ORDER — SODIUM CHLORIDE 0.9 % IV BOLUS
1000.0000 mL | Freq: Once | INTRAVENOUS | Status: AC
  Administered 2017-09-18: 1000 mL via INTRAVENOUS

## 2017-09-18 MED ORDER — CARVEDILOL 6.25 MG PO TABS
6.2500 mg | ORAL_TABLET | Freq: Two times a day (BID) | ORAL | Status: DC
  Filled 2017-09-18: qty 1

## 2017-09-18 MED ORDER — METOPROLOL TARTRATE 5 MG/5ML IV SOLN
10.0000 mg | Freq: Four times a day (QID) | INTRAVENOUS | Status: DC
  Administered 2017-09-18 – 2017-09-21 (×9): 10 mg via INTRAVENOUS
  Filled 2017-09-18 (×9): qty 10

## 2017-09-18 NOTE — Progress Notes (Signed)
PROGRESS NOTE [facility]   Alex Torres   TGY:[id number] DOB: 1955-02-21  DOA: 09/16/2017 PCP: Hayden Rasmussen, MD   Brief Narrative:  Alex Torres 62 year old male with medical history significant for hypertension and splenectomy (1972), alcohol abuse who presented to the emergency department complaining of abdominal pain associated with nausea, anorexia and fatigue.  Patient is a heavy drinker sixpack of beers per day.  Upon ED evaluation patient was found to be hypertensive, tachycardic, febrile with WBC of 30 2K.  CT A/P show acute pancreatitis gallbladder with gas and surrounding inflammatory changes.  Creatinine was 1.35 lipase 106 AST 44.  Patient was admitted with working diagnosis of acute pancreatitis complicated with acute renal injury.  Subjective: Patient seen and examined, he is having symptoms of alcohol withdrawal.  Overnight receive Ativan and Haldol due to agitation.  This morning patient is sedated, unable to interview.  Patient opening eyes to verbal command but fall back asleep easily.  Yesterday patient became hypoxic, ABG showed CO2 retention and he was started on BiPAP.  Assessment & Plan: Acute pancreatitis - improving Likely alcohol induced, CT scan consistent with acute pancreatitis, no evidence of necrotizing pancreatitis. Surrounding inflammatory changes to the duodenum.  Also gallbladder wall thickening with gas in the anterior gallbladder questionable cholecystitis. RUQ Ultrasound shows cholelithiasis with no signs of cholecystitis.    Clinically improved, lipase trending down.  Diet was advanced however patient has not been eating much as he remained sedated due to Ativan  for alcohol withdrawal.  Acute renal injury - resolved Suspect to be prerenal, dehydration. Creatinine has normalized, however not drinking much, will give 1 L bolus and monitor renal function in a.m. Continue to hold lisinopril.  Avoid hypotension and nephrotoxic  agent.  Leukocytosis Initially felt to be stress induced in setting of severe acute pancreatitis, however patient spiked fever and WBC trending down very slowly.  Patient tachycardic and with physiological changes concerning for possible sepsis/intra-abdominal infection.  Will start Zosyn.  Culture were obtained, however patient received antibiotics in ED prior to admission.  Alcohol abuse with alcohol withdrawal Sedated at the time of my exam unable to perform CIWA Continue to monitor, Ativan per CIWA, patient on Librium taper however unable to take p.o. meds due to sedation Continue Thiamine and Folic acid   Acute respiratory failure with hypoxia  Unclear etiology, suspect component of OSA, chest x-ray with no acute findings, CTA no PE.  Echocardiogram with normal EF.  ABG showed CO2 retention patient was placed on BiPAP with improvement of mental status, however he remain agitated and Ativan was given.  Wean oxygen as able, currently at 2 L nasal cannula with O2 sat above 95%.  Will continue to monitor.  Hyponatremia - Resolved  Due to hypovolemia, cont to monitor   HTN  BP elevated despite hydralazine as needed, will add IV metoprolol as patient is tachycardic as well. Continue hydralazine 5 mg every 4 hour as needed for SBP > 160 or DBP > 100. Monitor BP closely.   Lactic acidosis - Resolved   Enlarged prostate Patient report slow urine stream and difficulty initiating micturition. Will benefit from Flomax when able to tolerate p.o.  DVT prophylaxis: Lovenox  Code Status: Full Code  Family Communication: None at bedside  Disposition Plan: Home when medically stable  Consultants:   None   Procedures:   None   Antimicrobials:  None     Objective: Vitals:   09/18/17 0700 09/18/17 0746 09/18/17 0800 09/18/17 0805  BP:   (!) 174/103 (!) 166/90  Pulse:      Resp: 20  (!) 22 20  Temp:  98.5 F (36.9 C)    TempSrc:  Oral    SpO2: 93%  100% 100%  Weight:       Height:        Intake/Output Summary (Last 24 hours) at 09/18/2017 0854 Last data filed at 09/18/2017 0600 Gross per 24 hour  Intake 2298.75 ml  Output 1785 ml  Net 513.75 ml   Filed Weights   09/16/17 1218 09/16/17 1641  Weight: 90.7 kg (200 lb) 93.8 kg (206 lb 12.7 oz)    Examination:  General: Sedated, however respond to verbal stimuli Cardiovascular: RRR, S1/S2 +, no rubs, no gallops Respiratory: Breath sounds diminished bilaterally, no wheezing or crackles Abdominal: Soft, NT, ND Extremities: no edema  Data Reviewed: I have personally reviewed following labs and imaging studies  CBC: Recent Labs  Lab 09/16/17 1234 09/17/17 0353 09/18/17 0313  WBC 32.5* 29.7* 27.9*  NEUTROABS 28.9*  --  23.1*  HGB 12.9* 11.2* 10.3*  HCT 39.2 36.0* 34.8*  MCV 69.5* 73.6* 77.0*  PLT 211 225 194   Basic Metabolic Panel: Recent Labs  Lab 09/16/17 1234 09/17/17 0353 09/18/17 0313  NA 127* 135 135  K 3.5 3.7 3.4*  CL 93* 101 97*  CO2 20* 25 31  GLUCOSE 98 68* 113*  BUN 24* 14 11  CREATININE 1.35* 0.86 0.73  CALCIUM 8.5* 8.3* 8.3*  MG  --   --  1.9   GFR: Estimated Creatinine Clearance: 106.8 mL/min (by C-G formula based on SCr of 0.73 mg/dL). Liver Function Tests: Recent Labs  Lab 09/16/17 1234 09/17/17 0353  AST 44* 32  ALT 41 32  ALKPHOS 64 54  BILITOT 1.1 1.0  PROT 7.7 6.7  ALBUMIN 3.3* 2.8*   Recent Labs  Lab 09/16/17 1234 09/17/17 0353  LIPASE 106* 40   No results for input(s): AMMONIA in the last 168 hours. Coagulation Profile: No results for input(s): INR, PROTIME in the last 168 hours. Cardiac Enzymes: No results for input(s): CKTOTAL, CKMB, CKMBINDEX, TROPONINI in the last 168 hours. BNP (last 3 results) No results for input(s): PROBNP in the last 8760 hours. HbA1C: No results for input(s): HGBA1C in the last 72 hours. CBG: Recent Labs  Lab 09/16/17 1232 09/17/17 1155 09/17/17 1803  GLUCAP 99 66* 104*   Lipid Profile: Recent Labs     09/17/17 0353  CHOL 95  HDL 51  LDLCALC 35  TRIG 43  CHOLHDL 1.9   Thyroid Function Tests: No results for input(s): TSH, T4TOTAL, FREET4, T3FREE, THYROIDAB in the last 72 hours. Anemia Panel: No results for input(s): VITAMINB12, FOLATE, FERRITIN, TIBC, IRON, RETICCTPCT in the last 72 hours. Sepsis Labs: Recent Labs  Lab 09/16/17 1241 09/16/17 1440  LATICACIDVEN 2.93* 1.59    Recent Results (from the past 240 hour(s))  Urine culture     Status: None   Collection Time: 09/16/17 12:34 PM  Result Value Ref Range Status   Specimen Description   Final    URINE, RANDOM Performed at [facility], [address] [place], [place] [address]    Special Requests   Final    NONE Performed at [facility], [address] [place], [place] [address]    Culture   Final    NO GROWTH Performed at [facility], [place] [address], [place], [place] [address]  Report Status 09/17/2017 FINAL  Final  MRSA PCR Screening     Status: None   Collection Time: 09/16/17  4:45 PM  Result Value Ref Range Status   MRSA by PCR NEGATIVE NEGATIVE Final    Comment:        The GeneXpert MRSA Assay (FDA approved for NASAL specimens only), is one component of a comprehensive MRSA colonization surveillance program. It is not intended to diagnose MRSA infection nor to guide or monitor treatment for MRSA infections. Performed at [facility], [place] [address] [place],  [address]       Radiology Studies: Ct Angio Chest Pe W Or Wo Contrast  Result Date: 09/17/2017 CLINICAL DATA:  62 year old male with a history of possible pulmonary embolism EXAM: CT ANGIOGRAPHY CHEST WITH CONTRAST TECHNIQUE: Multidetector CT imaging of the chest was performed using the standard protocol during bolus administration of intravenous contrast. Multiplanar CT image reconstructions and MIPs were obtained to evaluate the vascular anatomy. CONTRAST:  19mL ISOVUE-370 IOPAMIDOL  (ISOVUE-370) INJECTION 76% COMPARISON:  CT abdomen 09/16/2017 FINDINGS: Cardiovascular: Heart: Cardiomegaly. No pericardial fluid/thickening. Calcifications of the left anterior descending coronary artery. Aorta: Unremarkable course, caliber, contour of the thoracic aorta. No aneurysm or dissection flap. No periaortic fluid. Pulmonary arteries: No central, lobar, segmental, or proximal subsegmental filling defects. Mediastinum/Nodes: Multiple small lymph nodes of the mediastinum. Largest in the prevascular nodal station measures 11 mm-12 mm. No comparison available. Unremarkable appearance of the thoracic esophagus. Small hiatal hernia. Unremarkable appearance of the thoracic inlet. Lungs/Pleura: Central airways are clear. Right-sided small pleural effusion. Trace left-sided pleural fluid. Chronic volume loss of the right middle lobe. Volume loss of the right lower lobe. No evidence of consolidative airspace disease. Respiratory motion somewhat limits evaluation. No pneumothorax. Upper Abdomen: Steatosis. Tissue of the left abdomen compatible with splenosis given the patient's history. Inflammatory changes adjacent to the pancreas, better characterized on recent CT. Musculoskeletal: No acute displaced fracture. Accentuated kyphotic curvature. Degenerative changes of the thoracic spine. Review of the MIP images confirms the above findings. IMPRESSION: CT negative for pulmonary emboli. Volume loss of the right middle lobe and right lower lobe with right greater than left pleural effusions. Atherosclerosis and associated coronary artery disease involving the left anterior descending coronary artery. Aortic Atherosclerosis (ICD10-I70.0). Steatosis Electronically Signed   By: Corrie Mckusick D.O.   On: 09/17/2017 10:21   Ct Abdomen Pelvis W Contrast  Result Date: 09/16/2017 CLINICAL DATA:  Severe abdominal pain. Abdominal distension. Decreased flatus. Nausea vomiting. Leukocytosis. Lactic acidosis. EXAM: CT ABDOMEN AND  PELVIS WITH CONTRAST TECHNIQUE: Multidetector CT imaging of the abdomen and pelvis was performed using the standard protocol following bolus administration of intravenous contrast. CONTRAST:  123mL ISOVUE-300 IOPAMIDOL (ISOVUE-300) INJECTION 61% COMPARISON:  None. FINDINGS: Lower chest: Mild-to-moderate dependent right lung base atelectasis. Trace dependent right pleural effusion. Hepatobiliary: Normal liver size. No liver mass. Nondistended gallbladder. Mild diffuse gallbladder wall thickening. Haziness of the pericholecystic fat. Small amount of gas in the anterior gallbladder. No radiopaque cholelithiasis. No biliary ductal dilatation. CBD diameter 6 mm. Pancreas: There is diffuse peripancreatic fat stranding and ill-defined fluid suggesting acute pancreatitis. No discrete pancreatic mass. No pancreatic duct dilation. No measurable peripancreatic fluid collection. Parenchymal enhancement appears preserved in pancreas. Spleen: Status post splenectomy. Multiple clustered splenules between the pancreatic tail and left kidney (series 2/image 32). Adrenals/Urinary Tract: Normal adrenals. Subcentimeter hypodense posterior interpolar right renal cortical lesion, too small to characterize, for which no follow-up is required. Otherwise normal kidneys, with  no hydronephrosis. Borderline mild diffuse bladder wall thickening. Bladder mildly distended. Stomach/Bowel: Small hiatal hernia. Otherwise normal nondistended stomach. Wall thickening with surrounding fat stranding and ill-defined fluid throughout the second and third portions of the duodenum. Normal caliber small bowel with no small bowel wall thickening. Normal appendix. Diffuse colonic diverticulosis, most prominent in the sigmoid colon, with no large bowel wall thickening or significant pericolonic fat stranding. Vascular/Lymphatic: Mildly atherosclerotic nonaneurysmal abdominal aorta. Patent portal, splenic, hepatic and renal veins. No pathologically enlarged  lymph nodes in the abdomen or pelvis. Reproductive: Mildly enlarged prostate with nonspecific internal prostatic calcifications. Other: No pneumoperitoneum, ascites or focal fluid collection. Musculoskeletal: No aggressive appearing focal osseous lesions. Small sclerotic lesions in right sacrum and left iliac bone, probably benign bone islands. Moderate thoracolumbar spondylosis. IMPRESSION: 1. Diffuse peripancreatic fat stranding and ill-defined fluid, compatible with acute pancreatitis. No definite evidence of necrotizing pancreatitis. No measurable peripancreatic fluid collections. 2. Wall thickening with surrounding inflammatory changes throughout the second and third portions of the duodenum, probably reactive to the suspected pancreatitis, versus less likely primary duodenitis. 3. Gallbladder wall thickening with gas in the anterior gallbladder, cannot exclude emphysematous cholecystitis. No radiopaque cholelithiasis. No biliary ductal dilatation. 4. Trace dependent right pleural effusion. 5. Small hiatal hernia. 6. Diffuse colonic diverticulosis. 7. Borderline mild diffuse bladder wall thickening, nonspecific, probably due to chronic bladder outlet obstruction by the mildly enlarged prostate. 8.  Aortic Atherosclerosis (ICD10-I70.0). These results were called by telephone at the time of interpretation on 09/16/2017 at 2:05 pm to Dr. Marda Stalker , who verbally acknowledged these results. Electronically Signed   By: Ilona Sorrel M.D.   On: 09/16/2017 14:23   Dg Chest Port 1 View  Result Date: 09/17/2017 CLINICAL DATA:  Hypoxia. EXAM: PORTABLE CHEST 1 VIEW COMPARISON:  None. FINDINGS: No pneumothorax. Heart size is prominent but exaggerated by low volume portable technique. The hila and mediastinum are normal. Elevation the right hemidiaphragm is noted. Low lung volumes are identified. No suspicious infiltrates noted. IMPRESSION: 1. Elevation the right hemidiaphragm and low lung volumes. 2. No focal  infiltrate.  No other acute abnormality. Electronically Signed   By: Dorise Bullion III M.D   On: 09/17/2017 08:17   US Abdomen Limited Ruq  Result Date: 09/16/2017 CLINICAL DATA:  Pancreatitis. EXAM: ULTRASOUND ABDOMEN LIMITED RIGHT UPPER QUADRANT COMPARISON:  None. FINDINGS: Gallbladder: Cholelithiasis. No wall thickening, pericholecystic fluid, or Murphy's sign. Common bile duct: Diameter: 4.9 mm Liver: Probable hepatic steatosis. Portal vein is patent on color Doppler imaging with normal direction of blood flow towards the liver. IMPRESSION: 1. Cholelithiasis without wall thickening, pericholecystic fluid, or Murphy's sign. Electronically Signed   By: Dorise Bullion III M.D   On: 09/16/2017 17:48      Scheduled Meds: . carvedilol  6.25 mg Oral BID WC  . chlordiazePOXIDE  25 mg Oral QID   Followed by  . [START ON 09/19/2017] chlordiazePOXIDE  25 mg Oral TID   Followed by  . [START ON 09/20/2017] chlordiazePOXIDE  10 mg Oral QID   Followed by  . [START ON 09/21/2017] chlordiazePOXIDE  5 mg Oral QID  . enoxaparin (LOVENOX) injection  40 mg Subcutaneous Daily  . folic acid  1 mg Intravenous Daily  . thiamine injection  100 mg Intravenous Daily   Continuous Infusions: . piperacillin-tazobactam (ZOSYN)  IV       LOS: 2 days    Time spent: Total of 35 minutes spent with pt, greater than 50% of which was spent in  discussion of  treatment, counseling and coordination of care   Chipper Oman, MD Pager: Text Page via [email or web address]   If 7PM-7AM, please contact night-coverage [email or web address] 09/18/2017, 8:54 AM   Note - This record has been created using [employer]. Chart creation errors have been sought, but may not always have been located. Such creation errors do not reflect on the standard of medical care.

## 2017-09-18 NOTE — Progress Notes (Signed)
Pharmacy Antibiotic Note  Alex Torres is a 62 y.o. male admitted on 09/16/2017 with pancreatitis.  Pharmacy has been consulted for Zosyn dosing.  Plan: Zosyn 3.375g IV q8h (4 hour infusion).  Will sign off  Height: 5\' 9"  (175.3 cm) Weight: 206 lb 12.7 oz (93.8 kg) IBW/kg (Calculated) : 70.7  Temp (24hrs), Avg:99 F (37.2 C), Min:97.7 F (36.5 C), Max:100.3 F (37.9 C)  Recent Labs  Lab 09/16/17 1234 09/16/17 1241 09/16/17 1440 09/17/17 0353 09/18/17 0313  WBC 32.5*  --   --  29.7* 27.9*  CREATININE 1.35*  --   --  0.86 0.73  LATICACIDVEN  --  2.93* 1.59  --   --     Estimated Creatinine Clearance: 106.8 mL/min (by C-G formula based on SCr of 0.73 mg/dL).    No Known Allergies  Thank you for allowing pharmacy to be a part of this patient's care.  Adrian Saran, PharmD, BCPS Pager [phone] 09/18/2017 7:56 AM

## 2017-09-18 NOTE — Progress Notes (Signed)
MD paged for increased BP. Orders for IV lopressor.

## 2017-09-19 ENCOUNTER — Inpatient Hospital Stay (HOSPITAL_COMMUNITY): Payer: Self-pay

## 2017-09-19 DIAGNOSIS — G9340 Encephalopathy, unspecified: Secondary | ICD-10-CM

## 2017-09-19 DIAGNOSIS — J9601 Acute respiratory failure with hypoxia: Secondary | ICD-10-CM

## 2017-09-19 DIAGNOSIS — J9602 Acute respiratory failure with hypercapnia: Secondary | ICD-10-CM

## 2017-09-19 LAB — BLOOD GAS, ARTERIAL
ACID-BASE EXCESS: 5.8 mmol/L — AB (ref 0.0–2.0)
Acid-Base Excess: 4.4 mmol/L — ABNORMAL HIGH (ref 0.0–2.0)
BICARBONATE: 30.6 mmol/L — AB (ref 20.0–28.0)
Bicarbonate: 32.6 mmol/L — ABNORMAL HIGH (ref 20.0–28.0)
Delivery systems: POSITIVE
Drawn by: 331471
Drawn by: 331471
EXPIRATORY PAP: 6
FIO2: 30
Inspiratory PAP: 18
O2 CONTENT: 4 L/min
O2 SAT: 96 %
O2 SAT: 97.1 %
PATIENT TEMPERATURE: 98.6
PCO2 ART: 47.6 mmHg (ref 32.0–48.0)
PH ART: 7.424 (ref 7.350–7.450)
PO2 ART: 84.6 mmHg (ref 83.0–108.0)
Patient temperature: 98.6
pCO2 arterial: 72.6 mmHg (ref 32.0–48.0)
pH, Arterial: 7.274 — ABNORMAL LOW (ref 7.350–7.450)
pO2, Arterial: 93.5 mmHg (ref 83.0–108.0)

## 2017-09-19 LAB — CBC WITH DIFFERENTIAL/PLATELET
Basophils Absolute: 0 10*3/uL (ref 0.0–0.1)
Basophils Relative: 0 %
EOS ABS: 0 10*3/uL (ref 0.0–0.7)
Eosinophils Relative: 0 %
HEMATOCRIT: 39.2 % (ref 39.0–52.0)
HEMOGLOBIN: 11.3 g/dL — AB (ref 13.0–17.0)
LYMPHS PCT: 11 %
Lymphs Abs: 2.7 10*3/uL (ref 0.7–4.0)
MCH: 22.4 pg — ABNORMAL LOW (ref 26.0–34.0)
MCHC: 28.8 g/dL — ABNORMAL LOW (ref 30.0–36.0)
MCV: 77.8 fL — ABNORMAL LOW (ref 78.0–100.0)
Monocytes Absolute: 2 10*3/uL — ABNORMAL HIGH (ref 0.1–1.0)
Monocytes Relative: 8 %
NEUTROS ABS: 19.8 10*3/uL — AB (ref 1.7–7.7)
Neutrophils Relative %: 81 %
Platelets: 244 10*3/uL (ref 150–400)
RBC: 5.04 MIL/uL (ref 4.22–5.81)
RDW: 18.3 % — ABNORMAL HIGH (ref 11.5–15.5)
WBC: 24.5 10*3/uL — AB (ref 4.0–10.5)

## 2017-09-19 LAB — BASIC METABOLIC PANEL
Anion gap: 9 (ref 5–15)
BUN: 12 mg/dL (ref 8–23)
CO2: 31 mmol/L (ref 22–32)
Calcium: 8.5 mg/dL — ABNORMAL LOW (ref 8.9–10.3)
Chloride: 96 mmol/L — ABNORMAL LOW (ref 98–111)
Creatinine, Ser: 0.68 mg/dL (ref 0.61–1.24)
GFR calc non Af Amer: >60 mL/min (ref >60)
Glucose, Bld: 98 mg/dL (ref 70–99)
POTASSIUM: 4 mmol/L (ref 3.5–5.1)
SODIUM: 136 mmol/L (ref 135–145)

## 2017-09-19 LAB — MAGNESIUM: MAGNESIUM: 1.9 mg/dL (ref 1.7–2.4)

## 2017-09-19 MED ORDER — CHLORHEXIDINE GLUCONATE 0.12 % MT SOLN
15.0000 mL | Freq: Two times a day (BID) | OROMUCOSAL | Status: DC
  Administered 2017-09-20 – 2017-09-24 (×9): 15 mL via OROMUCOSAL
  Filled 2017-09-19 (×7): qty 15

## 2017-09-19 MED ORDER — ORAL CARE MOUTH RINSE
15.0000 mL | Freq: Two times a day (BID) | OROMUCOSAL | Status: DC
  Administered 2017-09-20 – 2017-09-24 (×6): 15 mL via OROMUCOSAL

## 2017-09-19 MED ORDER — MAGNESIUM SULFATE 2 GM/50ML IV SOLN
2.0000 g | Freq: Once | INTRAVENOUS | Status: AC
  Administered 2017-09-19: 2 g via INTRAVENOUS
  Filled 2017-09-19: qty 50

## 2017-09-19 MED ORDER — DEXMEDETOMIDINE HCL IN NACL 200 MCG/50ML IV SOLN
0.1000 ug/kg/h | INTRAVENOUS | Status: DC
  Administered 2017-09-19 (×3): 0.4 ug/kg/h via INTRAVENOUS
  Administered 2017-09-20: 0.401 ug/kg/h via INTRAVENOUS
  Administered 2017-09-20: 0.2 ug/kg/h via INTRAVENOUS
  Administered 2017-09-20 (×2): 0.5 ug/kg/h via INTRAVENOUS
  Administered 2017-09-21: 0.401 ug/kg/h via INTRAVENOUS
  Filled 2017-09-19 (×9): qty 50

## 2017-09-19 MED ORDER — LORAZEPAM 2 MG/ML IJ SOLN
0.5000 mg | Freq: Once | INTRAMUSCULAR | Status: AC
  Administered 2017-09-19: 0.5 mg via INTRAVENOUS
  Filled 2017-09-19: qty 1

## 2017-09-19 NOTE — Progress Notes (Signed)
Dr. Chase Caller made aware that patient became lethargic upon 0.5mg  IV ativan administration. Patient placed back on BiPap. Will continue to monitor.

## 2017-09-19 NOTE — Progress Notes (Signed)
Rt placed pt on BIPAP due to ABG results.

## 2017-09-19 NOTE — Progress Notes (Signed)
PROGRESS NOTE    Alex Torres  KGY:[id number] DOB: 11-25-1955 DOA: 09/16/2017 PCP: Hayden Rasmussen, MD     Brief Narrative:  Alex Torres 62 year old male with medical history significant for hypertension and splenectomy (1972), alcohol abuse who presented to the emergency department complaining of abdominal pain associated with nausea, anorexia and fatigue.  Patient is a heavy drinker sixpack of beers per day.  Upon ED evaluation patient was found to be hypertensive, tachycardic, febrile with WBC of 32K.  CT A/P show acute pancreatitis gallbladder with gas and surrounding inflammatory changes.  Creatinine was 1.35, lipase 106, AST 44.  Patient was admitted with working diagnosis of acute pancreatitis complicated with acute renal injury.  New events last 24 hours / Subjective: Patient without any acute events overnight; he was placed on BiPAP due to CO2 retention yesterday. Patient remains very fatigued and lethargic this morning.  He is arousable to voice, is oriented to self, states that he is in [place] in a "brick building" when asked what kind of building we are in, but unable to specify that this is a hospital.  He is able to tell me that it is year 2019.  He states that he is ready to get out of here, but falls asleep frequently through the exam. Denies pain.   Assessment & Plan:   Active Problems:   Acute pancreatitis   Acute pancreatitis  -Likely alcohol induced, CT scan consistentwith acute pancreatitis, no evidence of necrotizing pancreatitis. Surrounding inflammatory changes to the duodenum. Also gallbladder wall thickening with gas in the anterior gallbladder questionable cholecystitis. RUQ Ultrasound shows cholelithiasis with no signs of cholecystitis. Dr. Quincy Simmonds discussed with general surgery.  -Continue supportive care  Acute renal injury  -Suspect to be prerenal, dehydration -Continue to hold lisinopril.  Avoid hypotension and nephrotoxic agent. -Resolved, Cr  normal   Leukocytosis -Initially felt to be stress induced in setting of severe acute pancreatitis, however patient spiked fever and WBC trending down slowly.  Patient tachycardic and with physiological changes concerning for possible sepsis/intra-abdominal infection. Started on Zosyn -Urine culture negative -Blood cultures pending -WBC improving, continue Zosyn and deescalate to PO when more alert to take PO med  Alcohol abuse with alcohol withdrawal -Continue to monitor, Ativan per CIWA, patient on Librium taper however unable to take p.o. meds due to sedation -Continue Thiamine and Folic acid  Acute respiratory failure with hypoxia  -Suspect component of OSA, chest x-ray with no acute findings, CTA no PE.  Echocardiogram with normal EF.  ABG showed CO2 retention patient was placed on BiPAP with improvement of mental status -Repeat ABG this morning   HTN -Hydralazine IV prn, metoprolol IV   Enlarged prostate -Patient report slow urine stream and difficulty initiating micturition -Will benefit from Flomax when able to tolerate p.o.   DVT prophylaxis: Lovenox Code Status: Full Family Communication: No family at bedside Disposition Plan: Pending improvement in clinical status, WBC, mentation, alcohol withdrawal   Consultants:   General surgery - Dr. Quincy Simmonds discussed   Procedures:   None   Antimicrobials:  Anti-infectives (From admission, onward)   Start     Dose/Rate Route Frequency Ordered Stop   09/18/17 1000  piperacillin-tazobactam (ZOSYN) IVPB 3.375 g     3.375 g 12.5 mL/hr over 240 Minutes Intravenous Every 8 hours 09/18/17 0758     09/16/17 1415  piperacillin-tazobactam (ZOSYN) IVPB 3.375 g     3.375 g 100 mL/hr over 30 Minutes Intravenous  Once 09/16/17 1401 09/16/17 1525  Objective: Vitals:   09/19/17 0000 09/19/17 0400 09/19/17 0800 09/19/17 0900  BP: (!) 186/103 135/78 (!) 172/94 (!) 190/104  Pulse:      Resp: (!) 27 (!) 21 20 (!) 34    Temp: 98.3 F (36.8 C) 99.3 F (37.4 C) 98.5 F (36.9 C)   TempSrc: Axillary Axillary Oral   SpO2: (!) 87% 92% 100% 100%  Weight:      Height:        Intake/Output Summary (Last 24 hours) at 09/19/2017 1024 Last data filed at 09/19/2017 0650 Gross per 24 hour  Intake 1194.49 ml  Output 865 ml  Net 329.49 ml   Filed Weights   09/16/17 1218 09/16/17 1641  Weight: 90.7 kg (200 lb) 93.8 kg (206 lb 12.7 oz)    Examination:  General exam: Appears calm and comfortable, fatigued, groggy, and lethargic appearing but alert to voice  Respiratory system: Clear to auscultation. Respiratory effort normal. Cardiovascular system: S1 & S2 heard, Tachycardic regular rhythm. No JVD, murmurs, rubs, gallops or clicks. No pedal edema. Gastrointestinal system: Abdomen is nondistended, soft and nontender. No organomegaly or masses felt. Normal bowel sounds heard. Central nervous system: Alert and oriented to self, year  Extremities: Symmetric in appearance  Skin: No rashes, lesions or ulcers on exposed skin   Data Reviewed: I have personally reviewed following labs and imaging studies  CBC: Recent Labs  Lab 09/16/17 1234 09/17/17 0353 09/18/17 0313 09/19/17 0313  WBC 32.5* 29.7* 27.9* 24.5*  NEUTROABS 28.9*  --  23.1* 19.8*  HGB 12.9* 11.2* 10.3* 11.3*  HCT 39.2 36.0* 34.8* 39.2  MCV 69.5* 73.6* 77.0* 77.8*  PLT 211 225 222 413   Basic Metabolic Panel: Recent Labs  Lab 09/16/17 1234 09/17/17 0353 09/18/17 0313 09/19/17 0313  NA 127* 135 135 136  K 3.5 3.7 3.4* 4.0  CL 93* 101 97* 96*  CO2 20* 25 31 31   GLUCOSE 98 68* 113* 98  BUN 24* 14 11 12   CREATININE 1.35* 0.86 0.73 0.68  CALCIUM 8.5* 8.3* 8.3* 8.5*  MG  --   --  1.9 1.9   GFR: Estimated Creatinine Clearance: 106.8 mL/min (by C-G formula based on SCr of 0.68 mg/dL). Liver Function Tests: Recent Labs  Lab 09/16/17 1234 09/17/17 0353  AST 44* 32  ALT 41 32  ALKPHOS 64 54  BILITOT 1.1 1.0  PROT 7.7 6.7  ALBUMIN  3.3* 2.8*   Recent Labs  Lab 09/16/17 1234 09/17/17 0353  LIPASE 106* 40   No results for input(s): AMMONIA in the last 168 hours. Coagulation Profile: No results for input(s): INR, PROTIME in the last 168 hours. Cardiac Enzymes: No results for input(s): CKTOTAL, CKMB, CKMBINDEX, TROPONINI in the last 168 hours. BNP (last 3 results) No results for input(s): PROBNP in the last 8760 hours. HbA1C: No results for input(s): HGBA1C in the last 72 hours. CBG: Recent Labs  Lab 09/16/17 1232 09/17/17 1155 09/17/17 1803  GLUCAP 99 66* 104*   Lipid Profile: Recent Labs    09/17/17 0353  CHOL 95  HDL 51  LDLCALC 35  TRIG 43  CHOLHDL 1.9   Thyroid Function Tests: No results for input(s): TSH, T4TOTAL, FREET4, T3FREE, THYROIDAB in the last 72 hours. Anemia Panel: No results for input(s): VITAMINB12, FOLATE, FERRITIN, TIBC, IRON, RETICCTPCT in the last 72 hours. Sepsis Labs: Recent Labs  Lab 09/16/17 1241 09/16/17 1440  LATICACIDVEN 2.93* 1.59    Recent Results (from the past 240 hour(s))  Urine culture  Status: None   Collection Time: 09/16/17 12:34 PM  Result Value Ref Range Status   Specimen Description   Final    URINE, RANDOM Performed at [facility], [address] [place], [place] [address]    Special Requests   Final    NONE Performed at [facility], [address] [place], [place] [address]    Culture   Final    NO GROWTH Performed at [facility], [place] [address], [place], [place] [address]    Report Status 09/17/2017 FINAL  Final  MRSA PCR Screening     Status: None   Collection Time: 09/16/17  4:45 PM  Result Value Ref Range Status   MRSA by PCR NEGATIVE NEGATIVE Final    Comment:        The GeneXpert MRSA Assay (FDA approved for NASAL specimens only), is one component of a comprehensive MRSA colonization surveillance program. It is not intended to diagnose MRSA infection nor to guide or monitor treatment  for MRSA infections. Performed at [facility], [place] [address], [place], [place] [address]        Radiology Studies: No results found.    Scheduled Meds: . chlordiazePOXIDE  25 mg Oral TID   Followed by  . [START ON 09/20/2017] chlordiazePOXIDE  10 mg Oral QID   Followed by  . [START ON 09/21/2017] chlordiazePOXIDE  5 mg Oral QID  . enoxaparin (LOVENOX) injection  40 mg Subcutaneous Daily  . folic acid  1 mg Intravenous Daily  . metoprolol tartrate  10 mg Intravenous Q6H  . thiamine injection  100 mg Intravenous Daily   Continuous Infusions: . sodium chloride 10 mL/hr at 09/19/17 1009  . piperacillin-tazobactam (ZOSYN)  IV 3.375 g (09/19/17 1011)     LOS: 3 days    Time spent: 35 minutes   Dessa Phi, DO [facility] [email or web address] Password [facility] 09/19/2017, 10:24 AM

## 2017-09-19 NOTE — Assessment & Plan Note (Signed)
Per [facility]ad

## 2017-09-19 NOTE — Assessment & Plan Note (Addendum)
Baseline high prob OSA Presumably reaction to cumulative effects of benzo and opioids since admit  Plan Stat cxr bipap Recheck abg

## 2017-09-19 NOTE — Assessment & Plan Note (Addendum)
Initially DTs agitaiton Now obtunded - likely due to cumulative opioid and benzo  Plan Do fluid washout BiPAP DC all benzo and opioids Do precedex gtt if agitated

## 2017-09-19 NOTE — Progress Notes (Signed)
MD made aware that patient appears lethargic, however, patient is arousable and able to follow simple commands.. Patient unable to answer questions with more than just "yes" or "no". RN expressed concerns for administering PO meds. MD okay to hold PO meds at this time. RN attempted to place [employer] back on patient, however, patient uncooperative and refused. STAT ABG ordered by MD. Will continue to monitor.
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
## 2017-09-19 NOTE — Progress Notes (Signed)
MD Physician-Brief Progress Note Patient Name: Alex Torres DOB: [birth year] MRN: [id number]   Date of Service  09/19/2017  HPI/Events of Note  Now agitated.    ICU Interventions  Dc bipap Ativan 0.5mg  IV x 1 Start precedex gtt     Intervention Category Intermediate Interventions: Other:  Brand Males 09/19/2017, 2:57 PM

## 2017-09-20 LAB — BLOOD GAS, ARTERIAL
Acid-Base Excess: 7.7 mmol/L — ABNORMAL HIGH (ref 0.0–2.0)
Bicarbonate: 32.3 mmol/L — ABNORMAL HIGH (ref 20.0–28.0)
Delivery systems: POSITIVE
Drawn by: 331471
FIO2: 30
HI FREQUENCY JET VENT PIP: 14
INSPIRATORY PAP: 6
O2 SAT: 97 %
PH ART: 7.452 — AB (ref 7.350–7.450)
PO2 ART: 87.5 mmHg (ref 83.0–108.0)
Patient temperature: 98.6
pCO2 arterial: 47 mmHg (ref 32.0–48.0)

## 2017-09-20 LAB — LIPASE, BLOOD: LIPASE: 22 U/L (ref 11–51)

## 2017-09-20 LAB — CBC
HCT: 34.1 % — ABNORMAL LOW (ref 39.0–52.0)
HEMOGLOBIN: 10.2 g/dL — AB (ref 13.0–17.0)
MCH: 22.5 pg — AB (ref 26.0–34.0)
MCHC: 29.9 g/dL — ABNORMAL LOW (ref 30.0–36.0)
MCV: 75.3 fL — ABNORMAL LOW (ref 78.0–100.0)
Platelets: 318 10*3/uL (ref 150–400)
RBC: 4.53 MIL/uL (ref 4.22–5.81)
RDW: 18.2 % — ABNORMAL HIGH (ref 11.5–15.5)
WBC: 16.5 10*3/uL — ABNORMAL HIGH (ref 4.0–10.5)

## 2017-09-20 LAB — PHOSPHORUS: PHOSPHORUS: 2.6 mg/dL (ref 2.5–4.6)

## 2017-09-20 LAB — BASIC METABOLIC PANEL
ANION GAP: 8 (ref 5–15)
BUN: 17 mg/dL (ref 8–23)
CHLORIDE: 99 mmol/L (ref 98–111)
CO2: 33 mmol/L — ABNORMAL HIGH (ref 22–32)
Calcium: 8.6 mg/dL — ABNORMAL LOW (ref 8.9–10.3)
Creatinine, Ser: 0.6 mg/dL — ABNORMAL LOW (ref 0.61–1.24)
GFR calc non Af Amer: >60 mL/min (ref >60)
Glucose, Bld: 114 mg/dL — ABNORMAL HIGH (ref 70–99)
POTASSIUM: 3.7 mmol/L (ref 3.5–5.1)
SODIUM: 140 mmol/L (ref 135–145)

## 2017-09-20 LAB — MAGNESIUM: MAGNESIUM: 2.3 mg/dL (ref 1.7–2.4)

## 2017-09-20 MED ORDER — LACTATED RINGERS IV SOLN
INTRAVENOUS | Status: DC
  Administered 2017-09-20 – 2017-09-22 (×4): via INTRAVENOUS

## 2017-09-20 MED ORDER — CHLORDIAZEPOXIDE HCL 5 MG PO CAPS
5.0000 mg | ORAL_CAPSULE | Freq: Three times a day (TID) | ORAL | Status: AC
  Administered 2017-09-20 – 2017-09-21 (×5): 5 mg via ORAL
  Filled 2017-09-20 (×5): qty 1

## 2017-09-20 MED ORDER — NICOTINE 14 MG/24HR TD PT24
14.0000 mg | MEDICATED_PATCH | Freq: Every day | TRANSDERMAL | Status: DC
  Administered 2017-09-20 – 2017-09-23 (×4): 14 mg via TRANSDERMAL
  Filled 2017-09-20 (×4): qty 1

## 2017-09-20 MED ORDER — OXYCODONE HCL 5 MG PO TABS
5.0000 mg | ORAL_TABLET | ORAL | Status: DC | PRN
  Administered 2017-09-20 – 2017-09-22 (×5): 5 mg via ORAL
  Filled 2017-09-20 (×5): qty 1
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
## 2017-09-20 NOTE — Progress Notes (Signed)
Pt refusing BiPAP qhs at this time.  Pt is agreeable to re-evaluate BiPAP need as the night progresses.  RT will continue to monitor.

## 2017-09-20 NOTE — Pre-Procedure Instructions (Signed)
DAIMIEN PATMON  09/20/2017      [facility] - [place] - [address] [place] [address] Phone: [phone] Fax: [phone]    Your procedure is scheduled on Monday,  August 12th.  Report to [facility] Admitting at 5:00 A.M.  Call this number if you have problems the morning of surgery:  [phone]   Remember:   Do not eat or drink after midnight on Sunday.     Take these medicines the morning of surgery with A SIP OF WATER: NONE  7 days prior to surgery STOP taking any Aspirin(unless otherwise instructed by your surgeon), Aleve, Naproxen, Ibuprofen, Motrin, Advil, Goody's, BC's, all herbal medications, fish oil, and all vitamins     Do not wear jewelry, make-up or nail polish.  Do not wear lotions, powders, or perfumes, or deodorant.  Do not shave 48 hours prior to surgery.  Do not bring valuables to the hospital.  [facility] is not responsible for any belongings or valuables.  Contacts, dentures or bridgework may not be worn into surgery.  Leave your suitcase in the car.  After surgery it may be brought to your room.  For patients admitted to the hospital, discharge time will be determined by your treatment team.  Patients discharged the day of surgery will not be allowed to drive home.   Special instructions:   [place]- Preparing For Surgery  Before surgery, you can play an important role. Because skin is not sterile, your skin needs to be as free of germs as possible. You can reduce the number of germs on your skin by washing with CHG (chlorahexidine gluconate) Soap before surgery.  CHG is an antiseptic cleaner which kills germs and bonds with the skin to continue killing germs even after washing.    Oral Hygiene is also important to reduce your risk of infection.  Remember - BRUSH YOUR TEETH THE MORNING OF SURGERY WITH YOUR REGULAR TOOTHPASTE  Please do not use if you have an  allergy to CHG or antibacterial soaps. If your skin becomes reddened/irritated stop using the CHG.  Do not shave (including legs and underarms) for at least 48 hours prior to first CHG shower. It is OK to shave your face.  Please follow these instructions carefully.   1. Shower the NIGHT BEFORE SURGERY and the MORNING OF SURGERY with CHG.   2. If you chose to wash your hair, wash your hair first as usual with your normal shampoo.  3. After you shampoo, rinse your hair and body thoroughly to remove the shampoo.  4. Use CHG as you would any other liquid soap. You can apply CHG directly to the skin and wash gently with a scrungie or a clean washcloth.   5. Apply the CHG Soap to your body ONLY FROM THE NECK DOWN.  Do not use on open wounds or open sores. Avoid contact with your eyes, ears, mouth and genitals (private parts). Wash Face and genitals (private parts)  with your normal soap.  6. Wash thoroughly, paying special attention to the area where your surgery will be performed.  7. Thoroughly rinse your body with warm water from the neck down.  8. DO NOT shower/wash with your normal soap after using and rinsing off the CHG Soap.  9. Pat yourself dry with a CLEAN TOWEL.  10. Wear CLEAN PAJAMAS to bed the night before surgery, wear comfortable clothes the morning  of surgery  11. Place CLEAN SHEETS on your bed the night of your first shower and DO NOT SLEEP WITH PETS.    Day of Surgery:  Do not apply any deodorants/lotions.  Please wear clean clothes to the hospital/surgery center.   Remember to brush your teeth WITH YOUR REGULAR TOOTHPASTE.    Please read over the following fact sheets that you were given.

## 2017-09-20 NOTE — Progress Notes (Signed)
[facility] Progress Note Patient Name: GEOVANNI RAHMING DOB: [birth year] MRN: [id number]   Date of Service  09/20/2017  HPI/Events of Note  Pancreatitis related pain, Nicotine craving  In patient with a history of chronic chewing tobacco abuse.  eICU Interventions  Oxycodone 5 mg po Q 4 hrs prn, Nicotrol 14 mg patch        Chandan Fly U Batsheva Stevick 09/20/2017, 10:37 PM

## 2017-09-20 NOTE — Progress Notes (Signed)
  PROGRESS NOTE  Patient currently admitted for pancreatitis, respiratory failure, alcohol withdrawal. Discussed with PCCM yesterday and today. Currently on precedex gtt and BiPAP.   TRH will assume care once off precedex gtt. I will plan to check patient chart tmrw AM.   Dessa Phi, DO [facility] [email or web address] Password [email or web address] 09/20/2017, 10:52 AM

## 2017-09-21 ENCOUNTER — Inpatient Hospital Stay (HOSPITAL_COMMUNITY)
Admission: RE | Admit: 2017-09-21 | Discharge: 2017-09-21 | Disposition: A | Discharge status: Home / Self Care | Payer: Commercial Managed Care - PPO | Source: Ambulatory Visit

## 2017-09-21 ENCOUNTER — Inpatient Hospital Stay (HOSPITAL_COMMUNITY): Payer: Self-pay

## 2017-09-21 DIAGNOSIS — G9340 Encephalopathy, unspecified: Secondary | ICD-10-CM

## 2017-09-21 LAB — CBC
HCT: 37.1 % — ABNORMAL LOW (ref 39.0–52.0)
HEMOGLOBIN: 11.3 g/dL — AB (ref 13.0–17.0)
MCH: 22.3 pg — AB (ref 26.0–34.0)
MCHC: 30.5 g/dL (ref 30.0–36.0)
MCV: 73.2 fL — ABNORMAL LOW (ref 78.0–100.0)
PLATELETS: 301 10*3/uL (ref 150–400)
RBC: 5.07 MIL/uL (ref 4.22–5.81)
RDW: 17.9 % — ABNORMAL HIGH (ref 11.5–15.5)
WBC: 19.5 10*3/uL — AB (ref 4.0–10.5)

## 2017-09-21 LAB — BASIC METABOLIC PANEL
ANION GAP: 10 (ref 5–15)
BUN: 12 mg/dL (ref 8–23)
CHLORIDE: 98 mmol/L (ref 98–111)
CO2: 32 mmol/L (ref 22–32)
Calcium: 8.6 mg/dL — ABNORMAL LOW (ref 8.9–10.3)
Creatinine, Ser: 0.68 mg/dL (ref 0.61–1.24)
GFR calc Af Amer: >60 mL/min (ref >60)
Glucose, Bld: 100 mg/dL — ABNORMAL HIGH (ref 70–99)
Potassium: 3.6 mmol/L (ref 3.5–5.1)
SODIUM: 140 mmol/L (ref 135–145)

## 2017-09-21 LAB — MAGNESIUM: MAGNESIUM: 1.9 mg/dL (ref 1.7–2.4)

## 2017-09-21 LAB — PHOSPHORUS: PHOSPHORUS: 3.1 mg/dL (ref 2.5–4.6)

## 2017-09-21 MED ORDER — HYDRALAZINE HCL 20 MG/ML IJ SOLN
10.0000 mg | INTRAMUSCULAR | Status: DC | PRN
  Administered 2017-09-21 – 2017-09-26 (×5): 10 mg via INTRAVENOUS
  Filled 2017-09-21 (×5): qty 1

## 2017-09-21 MED ORDER — CHLORDIAZEPOXIDE HCL 5 MG PO CAPS
5.0000 mg | ORAL_CAPSULE | Freq: Two times a day (BID) | ORAL | Status: AC
  Administered 2017-09-22 (×2): 5 mg via ORAL
  Filled 2017-09-21 (×2): qty 1

## 2017-09-21 MED ORDER — CHLORDIAZEPOXIDE HCL 5 MG PO CAPS
5.0000 mg | ORAL_CAPSULE | Freq: Every day | ORAL | Status: AC
  Administered 2017-09-23: 5 mg via ORAL
  Filled 2017-09-21: qty 1

## 2017-09-21 MED ORDER — HYDRALAZINE HCL 20 MG/ML IJ SOLN
5.0000 mg | Freq: Once | INTRAMUSCULAR | Status: AC
  Administered 2017-09-21: 5 mg via INTRAVENOUS

## 2017-09-21 MED ORDER — LISINOPRIL 10 MG PO TABS
20.0000 mg | ORAL_TABLET | Freq: Every day | ORAL | Status: DC
  Administered 2017-09-21 – 2017-09-23 (×3): 20 mg via ORAL
  Filled 2017-09-21 (×2): qty 1
  Filled 2017-09-21: qty 2

## 2017-09-21 MED ORDER — MAGNESIUM SULFATE 2 GM/50ML IV SOLN
2.0000 g | Freq: Once | INTRAVENOUS | Status: AC
  Administered 2017-09-21: 2 g via INTRAVENOUS
  Filled 2017-09-21: qty 50

## 2017-09-21 MED ORDER — AMLODIPINE BESYLATE 5 MG PO TABS
5.0000 mg | ORAL_TABLET | Freq: Every day | ORAL | Status: DC
  Administered 2017-09-21: 5 mg via ORAL
  Filled 2017-09-21: qty 1

## 2017-09-21 MED ORDER — TAMSULOSIN HCL 0.4 MG PO CAPS
0.4000 mg | ORAL_CAPSULE | Freq: Every day | ORAL | Status: DC
  Administered 2017-09-21 – 2017-09-23 (×3): 0.4 mg via ORAL
  Filled 2017-09-21 (×3): qty 1

## 2017-09-21 MED ORDER — AMLODIPINE BESYLATE 10 MG PO TABS
10.0000 mg | ORAL_TABLET | Freq: Every day | ORAL | Status: DC
  Administered 2017-09-22 – 2017-09-23 (×2): 10 mg via ORAL
  Filled 2017-09-21 (×2): qty 1

## 2017-09-21 MED ORDER — METOPROLOL TARTRATE 5 MG/5ML IV SOLN
5.0000 mg | INTRAVENOUS | Status: DC | PRN
  Administered 2017-09-21: 5 mg via INTRAVENOUS
  Filled 2017-09-21: qty 5

## 2017-09-21 MED ORDER — METOPROLOL SUCCINATE ER 25 MG PO TB24
25.0000 mg | ORAL_TABLET | Freq: Every day | ORAL | Status: DC
  Administered 2017-09-21 – 2017-09-23 (×3): 25 mg via ORAL
  Filled 2017-09-21 (×3): qty 1

## 2017-09-21 MED ORDER — METOPROLOL TARTRATE 5 MG/5ML IV SOLN: 5.0000 mg | INTRAVENOUS | Status: DC | PRN
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
## 2017-09-21 NOTE — Progress Notes (Signed)
PROGRESS NOTE    Alex Torres  UUV:[id number] DOB: 1955/05/03 DOA: 09/16/2017 PCP: Hayden Rasmussen, MD     Brief Narrative:  Alex Torres 62 year old male with medical history significant for hypertension and splenectomy (1972), alcohol abuse who presented to the emergency department complaining of abdominal pain associated with nausea, anorexia and fatigue.  Patient is a heavy drinker sixpack of beers per day.  Upon ED evaluation patient was found to be hypertensive, tachycardic, febrile with WBC of 32K.  CT A/P show acute pancreatitis gallbladder with gas and surrounding inflammatory changes.  Creatinine was 1.35, lipase 106, AST 44.  Patient was admitted with working diagnosis of acute pancreatitis complicated with acute renal injury.  New events last 24 hours / Subjective: Patient being weaned off precedex. He is very aggravated by a staff member this morning, but other than that no complaints. States he feels very weak.   Assessment & Plan:   Principal Problem:   Acute respiratory failure with hypoxia and hypercapnia (HCC) Active Problems:   Acute pancreatitis   Encephalopathy acute   Acute pancreatitis  -Likely alcohol induced, CT scan consistentwith acute pancreatitis, no evidence of necrotizing pancreatitis. Surrounding inflammatory changes to the duodenum. Also gallbladder wall thickening with gas in the anterior gallbladder questionable cholecystitis. RUQ Ultrasound shows cholelithiasis with no signs of cholecystitis. Dr. Quincy Simmonds discussed with general surgery.  -Continue supportive care -IVF LR -Advance to full liquid diet   Acute hypoxemic and hypercarbic respiratory failure -Secondary to oversedation, suspect component of OSA, chest x-ray with no acute findings, CTA no PE.  Echocardiogram with normal EF.  ABG showed CO2 retention -Now off BiPAP   Alcohol abuse with alcohol withdrawal -Now off precedex -Low dose librium taper   Acute renal injury  -Suspect to be  prerenal, dehydration -Resolved  -Resume lisinopril   Right lobar pneumonia -CXR 8/2: New right basilar atelectasis -Continue zosyn   HTN -Norvasc, lisinopril, hydralazine IV prn   Enlarged prostate -Patient report slow urine stream and difficulty initiating micturition -Flomax    DVT prophylaxis: Lovenox Code Status: Full Family Communication: No family at bedside Disposition Plan: Pending improvement in clinical status, PT OT eval    Consultants:   General surgery - Dr. Quincy Simmonds discussed  PCCM    Procedures:   None   Antimicrobials:  Anti-infectives (From admission, onward)   Start     Dose/Rate Route Frequency Ordered Stop   09/18/17 1000  piperacillin-tazobactam (ZOSYN) IVPB 3.375 g     3.375 g 12.5 mL/hr over 240 Minutes Intravenous Every 8 hours 09/18/17 0758     09/16/17 1415  piperacillin-tazobactam (ZOSYN) IVPB 3.375 g     3.375 g 100 mL/hr over 30 Minutes Intravenous  Once 09/16/17 1401 09/16/17 1525       Objective: Vitals:   09/21/17 0900 09/21/17 1000 09/21/17 1100 09/21/17 1142  BP: (!) 185/152 (!) 201/96 (!) 201/85 (!) 197/102  Pulse:      Resp: 18 (!) 28 (!) 29 (!) 25  Temp:      TempSrc:      SpO2: 93% 93% 93% 93%  Weight:      Height:        Intake/Output Summary (Last 24 hours) at 09/21/2017 1156 Last data filed at 09/21/2017 1120 Gross per 24 hour  Intake 1904.21 ml  Output 925 ml  Net 979.21 ml   Filed Weights   09/16/17 1218 09/16/17 1641  Weight: 90.7 kg (200 lb) 93.8 kg (206 lb 12.7 oz)  Examination: General exam: Appears calm and comfortable  Respiratory system: Clear to auscultation. Respiratory effort normal. Cardiovascular system: S1 & S2 heard, RRR. No JVD, murmurs, rubs, gallops or clicks. No pedal edema. Gastrointestinal system: Abdomen is nondistended, soft and nontender. No organomegaly or masses felt. Normal bowel sounds heard. Central nervous system: Alert and oriented. No focal neurological  deficits. Extremities: Symmetric  Skin: No rashes, lesions or ulcers Psychiatry: Judgement and insight appear stable    Data Reviewed: I have personally reviewed following labs and imaging studies  CBC: Recent Labs  Lab 09/16/17 1234 09/17/17 0353 09/18/17 0313 09/19/17 0313 09/20/17 0320 09/21/17 0320  WBC 32.5* 29.7* 27.9* 24.5* 16.5* 19.5*  NEUTROABS 28.9*  --  23.1* 19.8*  --   --   HGB 12.9* 11.2* 10.3* 11.3* 10.2* 11.3*  HCT 39.2 36.0* 34.8* 39.2 34.1* 37.1*  MCV 69.5* 73.6* 77.0* 77.8* 75.3* 73.2*  PLT 211 225 222 244 318 242   Basic Metabolic Panel: Recent Labs  Lab 09/17/17 0353 09/18/17 0313 09/19/17 0313 09/20/17 0320 09/21/17 0320  NA 135 135 136 140 140  K 3.7 3.4* 4.0 3.7 3.6  CL 101 97* 96* 99 98  CO2 25 31 31  33* 32  GLUCOSE 68* 113* 98 114* 100*  BUN 14 11 12 17 12   CREATININE 0.86 0.73 0.68 0.60* 0.68  CALCIUM 8.3* 8.3* 8.5* 8.6* 8.6*  MG  --  1.9 1.9 2.3 1.9  PHOS  --   --   --  2.6 3.1   GFR: Estimated Creatinine Clearance: 106.8 mL/min (by C-G formula based on SCr of 0.68 mg/dL). Liver Function Tests: Recent Labs  Lab 09/16/17 1234 09/17/17 0353  AST 44* 32  ALT 41 32  ALKPHOS 64 54  BILITOT 1.1 1.0  PROT 7.7 6.7  ALBUMIN 3.3* 2.8*   Recent Labs  Lab 09/16/17 1234 09/17/17 0353 09/20/17 1048  LIPASE 106* 40 22   No results for input(s): AMMONIA in the last 168 hours. Coagulation Profile: No results for input(s): INR, PROTIME in the last 168 hours. Cardiac Enzymes: No results for input(s): CKTOTAL, CKMB, CKMBINDEX, TROPONINI in the last 168 hours. BNP (last 3 results) No results for input(s): PROBNP in the last 8760 hours. HbA1C: No results for input(s): HGBA1C in the last 72 hours. CBG: Recent Labs  Lab 09/16/17 1232 09/17/17 1155 09/17/17 1803  GLUCAP 99 66* 104*   Lipid Profile: No results for input(s): CHOL, HDL, LDLCALC, TRIG, CHOLHDL, LDLDIRECT in the last 72 hours. Thyroid Function Tests: No results for  input(s): TSH, T4TOTAL, FREET4, T3FREE, THYROIDAB in the last 72 hours. Anemia Panel: No results for input(s): VITAMINB12, FOLATE, FERRITIN, TIBC, IRON, RETICCTPCT in the last 72 hours. Sepsis Labs: Recent Labs  Lab 09/16/17 1241 09/16/17 1440  LATICACIDVEN 2.93* 1.59    Recent Results (from the past 240 hour(s))  Urine culture     Status: None   Collection Time: 09/16/17 12:34 PM  Result Value Ref Range Status   Specimen Description   Final    URINE, RANDOM Performed at [facility], [address] [place], [place] [address]    Special Requests   Final    NONE Performed at [facility], [address] [place], [place] [address]    Culture   Final    NO GROWTH Performed at [facility], [place] [address] [place],  [address]    Report Status 09/17/2017 FINAL  Final  MRSA PCR Screening  Status: None   Collection Time: 09/16/17  4:45 PM  Result Value Ref Range Status   MRSA by PCR NEGATIVE NEGATIVE Final    Comment:        The GeneXpert MRSA Assay (FDA approved for NASAL specimens only), is one component of a comprehensive MRSA colonization surveillance program. It is not intended to diagnose MRSA infection nor to guide or monitor treatment for MRSA infections. Performed at [facility], [place] [address] [place], [place] [address]   Culture, blood (Routine X 2) w Reflex to ID Panel     Status: None (Preliminary result)   Collection Time: 09/18/17  4:29 PM  Result Value Ref Range Status   Specimen Description   Final    BLOOD LEFT ANTECUBITAL Performed at [facility] [address], [place], [place] [address]    Special Requests   Final    BOTTLES DRAWN AEROBIC AND ANAEROBIC Blood Culture adequate volume Performed at [facility] [address], [place], [place] [address]    Culture   Final    NO GROWTH 3 DAYS Performed at [facility], [place] [address]  [place], [place] [address]    Report Status PENDING  Incomplete  Culture, blood (Routine X 2) w Reflex to ID Panel     Status: None (Preliminary result)   Collection Time: 09/18/17  4:36 PM  Result Value Ref Range Status   Specimen Description   Final    BLOOD LEFT HAND Performed at [facility] [address], [place], [place] [address]    Special Requests   Final    BOTTLES DRAWN AEROBIC ONLY Blood Culture results may not be optimal due to an inadequate volume of blood received in culture bottles Performed at [facility] [address], [place], [place] [address]    Culture   Final    NO GROWTH 3 DAYS Performed at [facility], [place] [address] [place],  [address]    Report Status PENDING  Incomplete       Radiology Studies: Dg Chest Port 1v Same Day  Result Date: 09/21/2017 CLINICAL DATA:  Shortness of breath and chest pain EXAM: PORTABLE CHEST 1 VIEW COMPARISON:  09/19/2017 FINDINGS: Cardiac shadow is enlarged. The lungs are well aerated bilaterally. Elevation the right hemidiaphragm is seen. Some mild right basilar atelectasis is noted medially new from the prior exam. No bony abnormality is seen IMPRESSION: New right basilar atelectasis. Electronically Signed   By: Inez Catalina M.D.   On: 09/21/2017 11:26      Scheduled Meds: . amLODipine  5 mg Oral Daily  . chlordiazePOXIDE  5 mg Oral TID  . [START ON 09/22/2017] chlordiazePOXIDE  5 mg Oral BID  . [START ON 09/23/2017] chlordiazePOXIDE  5 mg Oral Daily  . chlorhexidine  15 mL Mouth Rinse BID  . enoxaparin (LOVENOX) injection  40 mg Subcutaneous Daily  . folic acid  1 mg Intravenous Daily  . lisinopril  20 mg Oral Daily  . mouth rinse  15 mL Mouth Rinse q12n4p  . nicotine  14 mg Transdermal Daily  . tamsulosin  0.4 mg Oral Daily  . thiamine injection  100 mg Intravenous Daily   Continuous Infusions: . sodium chloride Stopped (09/21/17 1015)  . lactated ringers 75 mL/hr at  09/21/17 1100  . magnesium sulfate 1 - 4 g bolus IVPB    . piperacillin-tazobactam (ZOSYN)  IV 12.5 mL/hr at 09/21/17  1100     LOS: 5 days    Time spent: 35 minutes   Dessa Phi, DO [facility] [email or web address] Password [email or web address] 09/21/2017, 11:56 AM

## 2017-09-22 LAB — MAGNESIUM: Magnesium: 1.9 mg/dL (ref 1.7–2.4)

## 2017-09-22 LAB — BASIC METABOLIC PANEL
Anion gap: 13 (ref 5–15)
BUN: 8 mg/dL (ref 8–23)
CALCIUM: 8.4 mg/dL — AB (ref 8.9–10.3)
CO2: 30 mmol/L (ref 22–32)
CREATININE: 0.51 mg/dL — AB (ref 0.61–1.24)
Chloride: 96 mmol/L — ABNORMAL LOW (ref 98–111)
GFR calc Af Amer: >60 mL/min (ref >60)
GFR calc non Af Amer: >60 mL/min (ref >60)
GLUCOSE: 140 mg/dL — AB (ref 70–99)
POTASSIUM: 3.5 mmol/L (ref 3.5–5.1)
Sodium: 139 mmol/L (ref 135–145)

## 2017-09-22 LAB — CBC
HEMATOCRIT: 37.9 % — AB (ref 39.0–52.0)
HEMOGLOBIN: 11.3 g/dL — AB (ref 13.0–17.0)
MCH: 22.5 pg — ABNORMAL LOW (ref 26.0–34.0)
MCHC: 29.8 g/dL — ABNORMAL LOW (ref 30.0–36.0)
MCV: 75.5 fL — AB (ref 78.0–100.0)
PLATELETS: 361 10*3/uL (ref 150–400)
RBC: 5.02 MIL/uL (ref 4.22–5.81)
RDW: 18.3 % — ABNORMAL HIGH (ref 11.5–15.5)
WBC: 20.8 10*3/uL — AB (ref 4.0–10.5)

## 2017-09-22 LAB — PHOSPHORUS: Phosphorus: 3.5 mg/dL (ref 2.5–4.6)

## 2017-09-22 MED ORDER — VITAMIN B-1 100 MG PO TABS
100.0000 mg | ORAL_TABLET | Freq: Every day | ORAL | Status: DC
  Administered 2017-09-22 – 2017-09-23 (×2): 100 mg via ORAL
  Filled 2017-09-22 (×2): qty 1

## 2017-09-22 MED ORDER — FOLIC ACID 1 MG PO TABS
1.0000 mg | ORAL_TABLET | Freq: Every day | ORAL | Status: DC
  Administered 2017-09-22 – 2017-09-23 (×2): 1 mg via ORAL
  Filled 2017-09-22 (×2): qty 1

## 2017-09-22 MED ORDER — OXYCODONE HCL 5 MG PO TABS
10.0000 mg | ORAL_TABLET | ORAL | Status: DC | PRN
  Administered 2017-09-23 (×2): 10 mg via ORAL
  Filled 2017-09-22 (×3): qty 2

## 2017-09-22 MED ORDER — MORPHINE SULFATE (PF) 4 MG/ML IV SOLN
4.0000 mg | Freq: Once | INTRAVENOUS | Status: AC
  Administered 2017-09-22: 4 mg via INTRAVENOUS
  Filled 2017-09-22: qty 1

## 2017-09-22 NOTE — Progress Notes (Signed)
PHARMACIST - PHYSICIAN COMMUNICATION  DR:   Maylene Roes  CONCERNING: IV to Oral Route Change Policy  RECOMMENDATION: This patient is receiving thiamine & folic acid by the intravenous route.  Based on criteria approved by the Pharmacy and Therapeutics Committee, the intravenous medication(s) is/are being converted to the equivalent oral dose form(s).   DESCRIPTION: These criteria include:  The patient is eating (either orally or via tube) and/or has been taking other orally administered medications for a least 24 hours  The patient has no evidence of active gastrointestinal bleeding or impaired GI absorption (gastrectomy, short bowel, patient on TNA or NPO).  If you have questions about this conversion, please contact the Pharmacy Department  []   [phone] )  [facility] []   [phone] )  [facility] []   [phone] )  [facility] []   [phone] )  [facility] [x]   [phone] )  [place], [place], [facility] 09/22/2017 9:50 AM

## 2017-09-22 NOTE — Evaluation (Signed)
Physical Therapy Evaluation Patient Details Name: Alex Torres MRN: [id number] DOB: 1955-02-24 Today's Date: 09/22/2017   History of Present Illness  [place] 62 year old male with medical history significant for hypertension and splenectomy (1972), alcohol abuse admitted with acute pancreatitis and had episode of respiratory failure requiring bipap.   Clinical Impression  Patient presents with decreased independence with mobility due to weakness, deconditioning and limited activity tolerance.  He will benefit from skilled PT in the acute setting to allow return home following SNF level rehab stay.     Follow Up Recommendations SNF;Supervision/Assistance - 24 hour    Equipment Recommendations  Rolling walker with 5" wheels    Recommendations for Other Services       Precautions / Restrictions Precautions Precautions: Fall      Mobility  Bed Mobility Overal bed mobility: Needs Assistance Bed Mobility: Supine to Sit     Supine to sit: Max assist     General bed mobility comments: assist for legs off bed and to lift trunk  Transfers Overall transfer level: Needs assistance Equipment used: Rolling walker (2 wheeled) Transfers: Sit to/from [employer] Transfers Sit to Stand: From elevated surface;Mod assist Stand pivot transfers: Max assist       General transfer comment: lifting help, increased time and cues for pushing through legs and for hand placement; increased time with walker to weight shift and move feet, initially moving, but not forward, cues and increased time for pivotal steps to chair  Ambulation/Gait                Stairs            Wheelchair Mobility    Modified Rankin (Stroke Patients Only)       Balance Overall balance assessment: Needs assistance   Sitting balance-Leahy Scale: Fair Sitting balance - Comments: sitting EOB once placed with feet on floor able to sit with S, but reported felt like he would fall over     Standing balance-Leahy Scale: Poor Standing balance comment: UE support and assist for balance                             Pertinent Vitals/Pain Pain Assessment: Faces Faces Pain Scale: Hurts a little bit Pain Location: abdomen Pain Descriptors / Indicators: Sore Pain Intervention(s): Monitored during session;Repositioned    Home Living Family/patient expects to be discharged to:: Private residence Living Arrangements: Alone   Type of Home: Mobile home Home Access: Stairs to enter Entrance Stairs-Rails: Psychiatric nurse of Steps: "a couple" Home Layout: One level Home Equipment: None      Prior Function Level of Independence: Independent         Comments: information taked from previous stay as pt lethargic     Hand Dominance   Dominant Hand: Right    Extremity/Trunk Assessment   Upper Extremity Assessment Upper Extremity Assessment: Generalized weakness(able to move arms, but they are heavy and hanging in sitting at EOB)    Lower Extremity Assessment Lower Extremity Assessment: Generalized weakness(moves legs in bed on his own, but generally weak)       Communication   Communication: No difficulties  Cognition Arousal/Alertness: Lethargic Behavior During Therapy: Flat affect Overall Cognitive Status: No family/caregiver present to determine baseline cognitive functioning  General Comments: follows commands slowly, lethargic and resistent to getting up due to "not sleeping several days"      General Comments General comments (skin integrity, edema, etc.): on RA upon my entry SpO2 77%, placed on O2 as per RN at 5LPM SpO2 up to 92%    Exercises     Assessment/Plan    PT Assessment Patient needs continued PT services  PT Problem List Decreased strength;Decreased mobility;Decreased balance;Decreased knowledge of use of DME;Decreased activity tolerance;Decreased safety  awareness;Decreased knowledge of precautions       PT Treatment Interventions DME instruction;Therapeutic activities;Gait training;Therapeutic exercise;Patient/family education;Functional mobility training;Balance training    PT Goals (Current goals can be found in the Care Plan section)  Acute Rehab PT Goals Patient Stated Goal: wants to go home PT Goal Formulation: Patient unable to participate in goal setting Time For Goal Achievement: 10/06/17 Potential to Achieve Goals: Good    Frequency Min 3X/week   Barriers to discharge        Co-evaluation               AM-PAC PT "6 Clicks" Daily Activity  Outcome Measure Difficulty turning over in bed (including adjusting bedclothes, sheets and blankets)?: Unable Difficulty moving from lying on back to sitting on the side of the bed? : Unable Difficulty sitting down on and standing up from a chair with arms (e.g., wheelchair, bedside commode, etc,.)?: Unable Help needed moving to and from a bed to chair (including a wheelchair)?: A Lot Help needed walking in hospital room?: Total Help needed climbing 3-5 steps with a railing? : Total 6 Click Score: 7    End of Session Equipment Utilized During Treatment: Gait belt;Oxygen Activity Tolerance: Patient limited by lethargy Patient left: in chair;with chair alarm set;with call bell/phone within reach Nurse Communication: Mobility status PT Visit Diagnosis: Other abnormalities of gait and mobility (R26.89);Muscle weakness (generalized) (M62.81)    Time: 5997-7414 PT Time Calculation (min) (ACUTE ONLY): 27 min   Charges:   PT Evaluation $PT Eval Moderate Complexity: 1 Mod PT Treatments $Therapeutic Activity: 8-22 mins        [place], [place] [phone] 09/22/2017   Reginia Naas 09/22/2017, 1:27 PM

## 2017-09-22 NOTE — Progress Notes (Signed)
OT Cancellation Note  Patient Details Name: Alex Torres MRN: [id number] DOB: 04-Oct-1955   Cancelled Treatment:     Chart review completed. Met with pt fast asleep in recliner, fork on lap and lunch untouched. Pt extremely lethargic and fixated only on getting more milk and stating he has not slept in days. Pt hardly able to keep eyes open despite significant verbal cueing and environmental stimulation. Discussed with RN she has not seen much of an alertness level change this date. Eval deferred 2/2 to extreme lethargy making participation unsuccessful. Will continue to follow and initiate OT services when pt more alert/approrpriate and available.  Zenovia Jarred, [place], OTR/L [phone]  Zenovia Jarred 09/22/2017, 3:13 PM

## 2017-09-22 NOTE — Progress Notes (Signed)
Spoke to patient with regards to Bipap placement and patient stated that he did not want to wear tonight.  He claims that he did not tolerate the device well last night.

## 2017-09-22 NOTE — Progress Notes (Signed)
PROGRESS NOTE    Alex Torres  OAC:[id number] DOB: 06-Jun-1955 DOA: 09/16/2017 PCP: Hayden Rasmussen, MD     Brief Narrative:  Alex Torres 62 year old male with medical history significant for hypertension and splenectomy (1972), alcohol abuse who presented to the emergency department complaining of abdominal pain associated with nausea, anorexia and fatigue.  Patient is a heavy drinker sixpack of beers per day.  Upon ED evaluation patient was found to be hypertensive, tachycardic, febrile with WBC of 32K.  CT A/P show acute pancreatitis gallbladder with gas and surrounding inflammatory changes.  Creatinine was 1.35, lipase 106, AST 44.  Patient was admitted with working diagnosis of acute pancreatitis complicated with acute renal injury.  New events last 24 hours / Subjective: No new complaints today.  Asking to eat lasagna.  He states that he needs to catch up on sleep.  Still having some abdominal pain, not any worse than previous.  Assessment & Plan:   Principal Problem:   Acute respiratory failure with hypoxia and hypercapnia (HCC) Active Problems:   Acute pancreatitis   Encephalopathy acute   Acute pancreatitis  -Likely alcohol induced, CT scan consistentwith acute pancreatitis, no evidence of necrotizing pancreatitis. Surrounding inflammatory changes to the duodenum. Also gallbladder wall thickening with gas in the anterior gallbladder questionable cholecystitis. RUQ Ultrasound shows cholelithiasis with no signs of cholecystitis. Dr. Quincy Simmonds discussed with general surgery.  -Continue supportive care -IVF LR -Advance to soft diet today  Acute hypoxemic and hypercarbic respiratory failure -Secondary to oversedation, suspect component of OSA, chest x-ray with no acute findings, CTA no PE.  Echocardiogram with normal EF.  ABG showed CO2 retention -Now off BiPAP.  Use BiPAP with sleep  Alcohol abuse with alcohol withdrawal -Now off precedex -Low dose librium taper   Acute  renal injury  -Suspect to be prerenal, dehydration -Resolved  -Resume lisinopril   Right lobar pneumonia -CXR 8/2: New right basilar atelectasis -Continue zosyn  -Continue to monitor CBC   HTN -Norvasc, lisinopril, hydralazine IV prn   Enlarged prostate -Patient report slow urine stream and difficulty initiating micturition -Flomax    DVT prophylaxis: Lovenox Code Status: Full Family Communication: No family at bedside Disposition Plan: Pending improvement in clinical status, PT OT eval    Consultants:   General surgery - Dr. Quincy Simmonds discussed  PCCM    Procedures:   None   Antimicrobials:  Anti-infectives (From admission, onward)   Start     Dose/Rate Route Frequency Ordered Stop   09/18/17 1000  piperacillin-tazobactam (ZOSYN) IVPB 3.375 g     3.375 g 12.5 mL/hr over 240 Minutes Intravenous Every 8 hours 09/18/17 0758     09/16/17 1415  piperacillin-tazobactam (ZOSYN) IVPB 3.375 g     3.375 g 100 mL/hr over 30 Minutes Intravenous  Once 09/16/17 1401 09/16/17 1525       Objective: Vitals:   09/22/17 0200 09/22/17 0348 09/22/17 0510 09/22/17 1001  BP:  (!) 164/86 (!) 156/87 (!) 155/86  Pulse:  (!) 103 (!) 108 (!) 104  Resp:  20    Temp:  97.7 F (36.5 C)    TempSrc:  Oral    SpO2: 97% 97%  96%  Weight:      Height:        Intake/Output Summary (Last 24 hours) at 09/22/2017 1047 Last data filed at 09/22/2017 0630 Gross per 24 hour  Intake 1734.04 ml  Output 800 ml  Net 934.04 ml   Filed Weights   09/16/17 1218 09/16/17  1641 09/21/17 1714  Weight: 90.7 kg (200 lb) 93.8 kg (206 lb 12.7 oz) 98.5 kg (217 lb 2.5 oz)    Examination: General exam: Appears calm and comfortable, fatigued in appearance  Respiratory system: Clear to auscultation. Respiratory effort normal. Cardiovascular system: S1 & S2 heard, RRR. No JVD, murmurs, rubs, gallops or clicks. No pedal edema. Gastrointestinal system: Abdomen is mildly distended, soft and nontender. No  organomegaly or masses felt. Normal bowel sounds heard. Central nervous system: Alert and oriented. No focal neurological deficits. Extremities: Symmetric  Skin: No rashes, lesions or ulcers Psychiatry: Judgement and insight appear poor but stable     Data Reviewed: I have personally reviewed following labs and imaging studies  CBC: Recent Labs  Lab 09/16/17 1234  09/18/17 0313 09/19/17 0313 09/20/17 0320 09/21/17 0320 09/22/17 0524  WBC 32.5*   < > 27.9* 24.5* 16.5* 19.5* 20.8*  NEUTROABS 28.9*  --  23.1* 19.8*  --   --   --   HGB 12.9*   < > 10.3* 11.3* 10.2* 11.3* 11.3*  HCT 39.2   < > 34.8* 39.2 34.1* 37.1* 37.9*  MCV 69.5*   < > 77.0* 77.8* 75.3* 73.2* 75.5*  PLT 211   < > 222 244 318 301 361   < > = values in this interval not displayed.   Basic Metabolic Panel: Recent Labs  Lab 09/18/17 0313 09/19/17 0313 09/20/17 0320 09/21/17 0320 09/22/17 0524  NA 135 136 140 140 139  K 3.4* 4.0 3.7 3.6 3.5  CL 97* 96* 99 98 96*  CO2 31 31 33* 32 30  GLUCOSE 113* 98 114* 100* 140*  BUN 11 12 17 12 8   CREATININE 0.73 0.68 0.60* 0.68 0.51*  CALCIUM 8.3* 8.5* 8.6* 8.6* 8.4*  MG 1.9 1.9 2.3 1.9 1.9  PHOS  --   --  2.6 3.1 3.5   GFR: Estimated Creatinine Clearance: 109.4 mL/min (A) (by C-G formula based on SCr of 0.51 mg/dL (L)). Liver Function Tests: Recent Labs  Lab 09/16/17 1234 09/17/17 0353  AST 44* 32  ALT 41 32  ALKPHOS 64 54  BILITOT 1.1 1.0  PROT 7.7 6.7  ALBUMIN 3.3* 2.8*   Recent Labs  Lab 09/16/17 1234 09/17/17 0353 09/20/17 1048  LIPASE 106* 40 22   No results for input(s): AMMONIA in the last 168 hours. Coagulation Profile: No results for input(s): INR, PROTIME in the last 168 hours. Cardiac Enzymes: No results for input(s): CKTOTAL, CKMB, CKMBINDEX, TROPONINI in the last 168 hours. BNP (last 3 results) No results for input(s): PROBNP in the last 8760 hours. HbA1C: No results for input(s): HGBA1C in the last 72 hours. CBG: Recent Labs    Lab 09/16/17 1232 09/17/17 1155 09/17/17 1803  GLUCAP 99 66* 104*   Lipid Profile: No results for input(s): CHOL, HDL, LDLCALC, TRIG, CHOLHDL, LDLDIRECT in the last 72 hours. Thyroid Function Tests: No results for input(s): TSH, T4TOTAL, FREET4, T3FREE, THYROIDAB in the last 72 hours. Anemia Panel: No results for input(s): VITAMINB12, FOLATE, FERRITIN, TIBC, IRON, RETICCTPCT in the last 72 hours. Sepsis Labs: Recent Labs  Lab 09/16/17 1241 09/16/17 1440  LATICACIDVEN 2.93* 1.59    Recent Results (from the past 240 hour(s))  Urine culture     Status: None   Collection Time: 09/16/17 12:34 PM  Result Value Ref Range Status   Specimen Description   Final    URINE, RANDOM Performed at [facility], [address], [place], [place] [address]  Special Requests   Final    NONE Performed at [facility], [address] [place], [place] [address]    Culture   Final    NO GROWTH Performed at [facility], [place] [address], [place], [place] [address]    Report Status 09/17/2017 FINAL  Final  MRSA PCR Screening     Status: None   Collection Time: 09/16/17  4:45 PM  Result Value Ref Range Status   MRSA by PCR NEGATIVE NEGATIVE Final    Comment:        The GeneXpert MRSA Assay (FDA approved for NASAL specimens only), is one component of a comprehensive MRSA colonization surveillance program. It is not intended to diagnose MRSA infection nor to guide or monitor treatment for MRSA infections. Performed at [facility], [place] [address], [place], [place] [address]   Culture, blood (Routine X 2) w Reflex to ID Panel     Status: None (Preliminary result)   Collection Time: 09/18/17  4:29 PM  Result Value Ref Range Status   Specimen Description   Final    BLOOD LEFT ANTECUBITAL Performed at [place] [address], [place], [place] [address]    Special Requests   Final    BOTTLES DRAWN AEROBIC AND  ANAEROBIC Blood Culture adequate volume Performed at [place] [address], [place], [place] [address]    Culture   Final    NO GROWTH 3 DAYS Performed at [facility], [place] [address] [place], [place] [address]    Report Status PENDING  Incomplete  Culture, blood (Routine X 2) w Reflex to ID Panel     Status: None (Preliminary result)   Collection Time: 09/18/17  4:36 PM  Result Value Ref Range Status   Specimen Description   Final    BLOOD LEFT HAND Performed at [place] [address], [place], [place] [address]    Special Requests   Final    BOTTLES DRAWN AEROBIC ONLY Blood Culture results may not be optimal due to an inadequate volume of blood received in culture bottles Performed at [place] [place] [address], [place], [place] [address]    Culture   Final    NO GROWTH 3 DAYS Performed at [facility], [place] [address] [place], [place] [address]    Report Status PENDING  Incomplete       Radiology Studies: Dg Chest Port 1v Same Day  Result Date: 09/21/2017 CLINICAL DATA:  Shortness of breath and chest pain EXAM: PORTABLE CHEST 1 VIEW COMPARISON:  09/19/2017 FINDINGS: Cardiac shadow is enlarged. The lungs are well aerated bilaterally. Elevation the right hemidiaphragm is seen. Some mild right basilar atelectasis is noted medially new from the prior exam. No bony abnormality is seen IMPRESSION: New right basilar atelectasis. Electronically Signed   By: Inez Catalina M.D.   On: 09/21/2017 11:26      Scheduled Meds: . amLODipine  10 mg Oral Daily  . chlordiazePOXIDE  5 mg Oral BID  . [START ON 09/23/2017] chlordiazePOXIDE  5 mg Oral Daily  . chlorhexidine  15 mL Mouth Rinse BID  . enoxaparin (LOVENOX) injection  40 mg Subcutaneous Daily  . folic acid  1 mg Oral Daily  . lisinopril  20 mg Oral Daily  . mouth rinse  15 mL Mouth Rinse q12n4p  . metoprolol succinate  25 mg Oral Daily  . nicotine  14 mg Transdermal Daily  . tamsulosin  0.4 mg Oral Daily  . thiamine  100 mg Oral Daily   Continuous Infusions: . sodium chloride 10 mL/hr at 09/21/17 1600  . lactated ringers 50 mL/hr at 09/21/17 2341  . piperacillin-tazobactam (ZOSYN)  IV 3.375 g (09/22/17 1004)     LOS: 6 days    Time spent: 25 minutes   Dessa Phi, DO [facility] [email or web address] Password [facility] 09/22/2017, 10:47 AM

## 2017-09-22 NOTE — Plan of Care (Signed)
VSS, patient still requiring oxygen at 5 liters to maintain oxygen saturation in the 90's.  Up to chair for most of shift, 2 assist to get back in bed.  Patient drowsy for much of shift, states he has not "slept in 4 days".  Sister in to visit.

## 2017-09-23 ENCOUNTER — Inpatient Hospital Stay (HOSPITAL_COMMUNITY): Payer: Self-pay

## 2017-09-23 LAB — CULTURE, BLOOD (ROUTINE X 2)
Culture: NO GROWTH
Culture: NO GROWTH
SPECIAL REQUESTS: ADEQUATE

## 2017-09-23 LAB — CBC
HEMATOCRIT: 36.1 % — AB (ref 39.0–52.0)
HEMOGLOBIN: 10.5 g/dL — AB (ref 13.0–17.0)
MCH: 22.6 pg — ABNORMAL LOW (ref 26.0–34.0)
MCHC: 29.1 g/dL — ABNORMAL LOW (ref 30.0–36.0)
MCV: 77.6 fL — ABNORMAL LOW (ref 78.0–100.0)
Platelets: 384 10*3/uL (ref 150–400)
RBC: 4.65 MIL/uL (ref 4.22–5.81)
RDW: 18.9 % — ABNORMAL HIGH (ref 11.5–15.5)
WBC: 23.2 10*3/uL — ABNORMAL HIGH (ref 4.0–10.5)

## 2017-09-23 LAB — IRON AND TIBC
IRON: 19 ug/dL — AB (ref 45–182)
SATURATION RATIOS: 8 % — AB (ref 17.9–39.5)
TIBC: 236 ug/dL — AB (ref 250–450)
UIBC: 217 ug/dL

## 2017-09-23 LAB — BASIC METABOLIC PANEL
Anion gap: 11 (ref 5–15)
BUN: 10 mg/dL (ref 8–23)
CHLORIDE: 93 mmol/L — AB (ref 98–111)
CO2: 35 mmol/L — AB (ref 22–32)
CREATININE: 0.61 mg/dL (ref 0.61–1.24)
Calcium: 8.7 mg/dL — ABNORMAL LOW (ref 8.9–10.3)
GFR calc Af Amer: >60 mL/min (ref >60)
GFR calc non Af Amer: >60 mL/min (ref >60)
Glucose, Bld: 151 mg/dL — ABNORMAL HIGH (ref 70–99)
POTASSIUM: 4.7 mmol/L (ref 3.5–5.1)
Sodium: 139 mmol/L (ref 135–145)

## 2017-09-23 LAB — MAGNESIUM: MAGNESIUM: 1.9 mg/dL (ref 1.7–2.4)

## 2017-09-23 LAB — FERRITIN: Ferritin: 110 ng/mL (ref 24–336)

## 2017-09-23 LAB — PHOSPHORUS: Phosphorus: 3.9 mg/dL (ref 2.5–4.6)

## 2017-09-23 MED ORDER — IOPAMIDOL (ISOVUE-300) INJECTION 61%
100.0000 mL | Freq: Once | INTRAVENOUS | Status: AC | PRN
  Administered 2017-09-23: 100 mL via INTRAVENOUS

## 2017-09-23 MED ORDER — MUSCLE RUB 10-15 % EX CREA
TOPICAL_CREAM | CUTANEOUS | Status: DC | PRN
  Administered 2017-09-23 – 2017-09-25 (×2): via TOPICAL
  Filled 2017-09-23 (×2): qty 85

## 2017-09-23 MED ORDER — IOPAMIDOL (ISOVUE-300) INJECTION 61%
INTRAVENOUS | Status: AC
  Filled 2017-09-23: qty 100

## 2017-09-23 MED ORDER — SODIUM CHLORIDE 0.9 % IV SOLN
510.0000 mg | Freq: Once | INTRAVENOUS | Status: AC
  Administered 2017-09-23: 510 mg via INTRAVENOUS
  Filled 2017-09-23: qty 17

## 2017-09-23 NOTE — Progress Notes (Signed)
Pt has declined use of BIPAP QHS, machine remains at bedside.  RT to monitor and assess as needed,

## 2017-09-23 NOTE — Plan of Care (Signed)
Pt still requiring 5 LPM of O2. Pt was not on O2 at home.  Pt is more A&O and is able to move around in bed on his own, decreasing his chance of skin injury.

## 2017-09-23 NOTE — Plan of Care (Signed)
Patient able to tolerate titration of oxygen down to 4 liters per [place] this shift.  Up to chair x 2 and is gradually improving in strength.  Medicated for back pain x 1 this shift with improvement.  Brother in to visit.

## 2017-09-23 NOTE — Progress Notes (Signed)
O2 sat 99% on 5 liters while lying in bed, oxygen removed for 15 minutes, recheck while still lying in bed on room air 82%.  Will attempt ambulatory pulse ox when patient out of bed after breakfast.

## 2017-09-23 NOTE — Evaluation (Signed)
Occupational Therapy Evaluation Patient Details Name: Alex Torres MRN: [id number] DOB: Mar 09, 1955 Today's Date: 09/23/2017    History of Present Illness Alex Torres 62 year old male with medical history significant for hypertension and splenectomy (1972), alcohol abuse admitted with acute pancreatitis and had episode of respiratory failure requiring bipap.    Clinical Impression   Met pt lying supine in bed, intermittently lethargic. Pt alertness level waxes and wanes, improve with functional mobility. By end of session, pt carrying on appropriate conversation and joking with OT. Max A with verbal and tactile cues needed for bed mobility. Bed <> chair completed with max A pivot, pt took few small steps with significant verbal and tactile cueing. Pt completed oral care while sitting in recliner with min A, [place] WFL to open packaging. Pt with moderate limitations of functional UE usage at baseline, reports needing another neck surgery. He states this limits him from washing hair and maintaining his beard. Encouraged pt to start with BUE ceiling punches x10 3 times per day. Will benefit from continued OT to address UE strengthening and functional engagement in BADL and to build activity tolerance for BADL engagement.     Follow Up Recommendations  SNF;Supervision/Assistance - 24 hour;Other (comment)    Equipment Recommendations  Other (comment)(TBD)    Recommendations for Other Services       Precautions / Restrictions Precautions Precautions: Fall Restrictions Weight Bearing Restrictions: No      Mobility Bed Mobility Overal bed mobility: Needs Assistance Bed Mobility: Supine to Sit     Supine to sit: Max assist     General bed mobility comments: assist for legs off bed and to lift trunk, verbal and tactile cues needed to "walk" legs off of bed  Transfers Overall transfer level: Needs assistance Equipment used: None Transfers: Sit to/from Stand;Stand Pivot Transfers Sit  to Stand: From elevated surface;+2 physical assistance Stand pivot transfers: +2 physical assistance;Mod assist       General transfer comment: pt needing A to initiate standing, VC's for each small step and for hand placement    Balance Overall balance assessment: Needs assistance Sitting-balance support: No upper extremity supported Sitting balance-Leahy Scale: Fair Sitting balance - Comments: pt able to maintain upright    Standing balance support: Bilateral upper extremity supported Standing balance-Leahy Scale: Poor Standing balance comment: neede UE support from therapist to manitain balance                           ADL either performed or assessed with clinical judgement   ADL Overall ADL's : Needs assistance/impaired Eating/Feeding: Sitting;Set up   Grooming: Minimal assistance;Sitting;Oral care Grooming Details (indicate cue type and reason): in recliner Upper Body Bathing: Set up;Sitting Upper Body Bathing Details (indicate cue type and reason): in recliner Lower Body Bathing: Maximal assistance;Total assistance;Sit to/from stand   Upper Body Dressing : Minimal assistance;Sitting   Lower Body Dressing: Total assistance;Bed level Lower Body Dressing Details (indicate cue type and reason): pt requiring total A to don socks bed level Toilet Transfer: Total assistance Toilet Transfer Details (indicate cue type and reason): foley bag in place           General ADL Comments: alertness level affects pt functional level     Vision Baseline Vision/History: Wears glasses Wears Glasses: At all times Patient Visual Report: No change from baseline       Perception     Praxis      Pertinent Vitals/Pain Pain  Assessment: No/denies pain     Hand Dominance     Extremity/Trunk Assessment Upper Extremity Assessment Upper Extremity Assessment: Generalized weakness;RUE deficits/detail;LUE deficits/detail RUE Deficits / Details: reports extreme weakness at  baseline limiting ability to raise arms overhead for prolong periods of time LUE Deficits / Details: reports extreme weakness at baseline limiting ability to raise arms overhead for prolong periods of time   Lower Extremity Assessment Lower Extremity Assessment: Generalized weakness       Communication Communication Communication: No difficulties   Cognition Arousal/Alertness: Awake/alert Behavior During Therapy: Flat affect Overall Cognitive Status: No family/caregiver present to determine baseline cognitive functioning                                 General Comments: pt initially lethargic, improved alertness when beginning to engage in functional mobility   General Comments  4L O2 to maintain VSS    Exercises     Shoulder Instructions      Home Living Family/patient expects to be discharged to:: Private residence Living Arrangements: Alone Available Help at Discharge: Other (Comment)(pt shrug and not respond about help at home) Type of Home: Mobile home Home Access: Stairs to enter Entrance Stairs-Number of Steps: reports 6-8, poor historian   Home Layout: One level     Bathroom Shower/Tub: Corporate investment banker: Standard     Home Equipment: None          Prior Functioning/Environment Level of Independence: Independent        Comments: pt was not working, but was independently engaging in all ADL/IADL        OT Problem List: Decreased strength;Impaired balance (sitting and/or standing);Decreased range of motion;Decreased activity tolerance;Impaired UE functional use      OT Treatment/Interventions: Self-care/ADL training;DME and/or AE instruction;Therapeutic activities;Therapeutic exercise    OT Goals(Current goals can be found in the care plan section) Acute Rehab OT Goals Patient Stated Goal: to go home and get stronger OT Goal Formulation: With patient Time For Goal Achievement: 10/07/17 Potential to Achieve  Goals: Good  OT Frequency: Min 3X/week   Barriers to D/C:    no current insurance on file       Co-evaluation              AM-PAC PT "6 Clicks" Daily Activity     Outcome Measure Help from another person eating meals?: A Little Help from another person taking care of personal grooming?: A Little Help from another person toileting, which includes using toliet, bedpan, or urinal?: Total Help from another person bathing (including washing, rinsing, drying)?: Total Help from another person to put on and taking off regular upper body clothing?: A Lot Help from another person to put on and taking off regular lower body clothing?: Total 6 Click Score: 11   End of Session Equipment Utilized During Treatment: Gait belt;Oxygen Nurse Communication: Mobility status  Activity Tolerance: Patient tolerated treatment well Patient left: in chair;with call bell/phone within reach;with chair alarm set  OT Visit Diagnosis: Muscle weakness (generalized) (M62.81);Other abnormalities of gait and mobility (R26.89)                Time: 8413-2440 OT Time Calculation (min): 41 min Charges:  OT General Charges $OT Visit: 1 Visit OT Evaluation $OT Eval Moderate Complexity: 1 Mod OT Treatments $Self Care/Home Management : 23-37 mins  Zenovia Jarred, MSOT, OTR/L [phone]  Zenovia Jarred 09/23/2017, 10:56 AM

## 2017-09-23 NOTE — Progress Notes (Addendum)
PROGRESS NOTE    Alex Torres  QQI:[id number] DOB: 21-May-1955 DOA: 09/16/2017 PCP: Hayden Rasmussen, MD     Brief Narrative:  Alex Torres 62 year old male with medical history significant for hypertension and splenectomy (1972), alcohol abuse who presented to the emergency department complaining of abdominal pain associated with nausea, anorexia and fatigue.  Patient is a heavy drinker sixpack of beers per day.  Upon ED evaluation patient was found to be hypertensive, tachycardic, febrile with WBC of 32K.  CT A/P show acute pancreatitis gallbladder with gas and surrounding inflammatory changes.  Creatinine was 1.35, lipase 106, AST 44.  Patient was admitted with working diagnosis of acute pancreatitis complicated with acute renal injury.  New events last 24 hours / Subjective: Sitting in chair.  Tolerated soft diet today.  Denies any shortness of breath, worsening abdominal pain.  However, he has required increase in O2, up to 5 L due to hypoxia in the 80s on room air.  Assessment & Plan:   Principal Problem:   Acute respiratory failure with hypoxia and hypercapnia (HCC) Active Problems:   Acute pancreatitis   Encephalopathy acute   Acute pancreatitis  -Likely alcohol induced, CT scan consistentwith acute pancreatitis, no evidence of necrotizing pancreatitis. Surrounding inflammatory changes to the duodenum. Also gallbladder wall thickening with gas in the anterior gallbladder questionable cholecystitis. RUQ Ultrasound shows cholelithiasis with no signs of cholecystitis. Dr. Quincy Simmonds discussed with general surgery.  -Continue supportive care -Clinically improving, although persistently elevated WBC. Check CT A/P  Acute hypoxemic and hypercarbic respiratory failure -Secondary to oversedation, suspect component of OSA, chest x-ray with no acute findings, CTA no PE.  Echocardiogram with normal EF.  ABG showed CO2 retention -Now off BiPAP.  Use BiPAP with sleep  Right lobar  pneumonia -CXR 8/2: New right basilar atelectasis -Continue zosyn  -Continue to monitor CBC, WBC continues to increase and now requiring more [place] O2 -Check CT chest  Alcohol abuse with alcohol withdrawal -Now off precedex -Low dose librium taper   Acute renal injury  -Suspect to be prerenal, dehydration -Resolved  -Resume lisinopril   HTN -Norvasc, lisinopril, hydralazine IV prn   Enlarged prostate -Patient report slow urine stream and difficulty initiating micturition -Flomax   Iron deficiency anemia -Feraheme IV   DVT prophylaxis: Lovenox Code Status: Full Family Communication: No family at bedside Disposition Plan: Pending improvement in clinical status, will need SNF once bed available    Consultants:   General surgery - Dr. Quincy Simmonds discussed  PCCM    Procedures:   None   Antimicrobials:  Anti-infectives (From admission, onward)   Start     Dose/Rate Route Frequency Ordered Stop   09/18/17 1000  piperacillin-tazobactam (ZOSYN) IVPB 3.375 g     3.375 g 12.5 mL/hr over 240 Minutes Intravenous Every 8 hours 09/18/17 0758     09/16/17 1415  piperacillin-tazobactam (ZOSYN) IVPB 3.375 g     3.375 g 100 mL/hr over 30 Minutes Intravenous  Once 09/16/17 1401 09/16/17 1525       Objective: Vitals:   09/23/17 0356 09/23/17 0700 09/23/17 0815 09/23/17 0908  BP: 135/81   123/65  Pulse: (!) 101   (!) 114  Resp: 16   18  Temp: 98 F (36.7 C)     TempSrc: Axillary     SpO2: (!) 89% 98% (!) 82% 96%  Weight:      Height:        Intake/Output Summary (Last 24 hours) at 09/23/2017 1219 Last data filed  at 09/23/2017 0200 Gross per 24 hour  Intake 1012.57 ml  Output 300 ml  Net 712.57 ml   Filed Weights   09/16/17 1218 09/16/17 1641 09/21/17 1714  Weight: 90.7 kg (200 lb) 93.8 kg (206 lb 12.7 oz) 98.5 kg (217 lb 2.5 oz)    Examination: General exam: Appears calm and comfortable  Respiratory system: Diminished bilateral bases, no rhonchi or wheeze.  Respiratory effort normal. On 5L [place] O2 Cardiovascular system: S1 & S2 heard, RRR. No JVD, murmurs, rubs, gallops or clicks. No pedal edema. Gastrointestinal system: Abdomen is nondistended, soft and nontender. No organomegaly or masses felt. Normal bowel sounds heard. Central nervous system: Alert and oriented. No focal neurological deficits. Extremities: Symmetric 5 x 5 power. Skin: No rashes, lesions or ulcers Psychiatry: Judgement and insight appear stable but poor . Mood & affect appropriate.     Data Reviewed: I have personally reviewed following labs and imaging studies  CBC: Recent Labs  Lab 09/16/17 1234  09/18/17 0313 09/19/17 0313 09/20/17 0320 09/21/17 0320 09/22/17 0524 09/23/17 0454  WBC 32.5*   < > 27.9* 24.5* 16.5* 19.5* 20.8* 23.2*  NEUTROABS 28.9*  --  23.1* 19.8*  --   --   --   --   HGB 12.9*   < > 10.3* 11.3* 10.2* 11.3* 11.3* 10.5*  HCT 39.2   < > 34.8* 39.2 34.1* 37.1* 37.9* 36.1*  MCV 69.5*   < > 77.0* 77.8* 75.3* 73.2* 75.5* 77.6*  PLT 211   < > 222 244 318 301 361 384   < > = values in this interval not displayed.   Basic Metabolic Panel: Recent Labs  Lab 09/19/17 0313 09/20/17 0320 09/21/17 0320 09/22/17 0524 09/23/17 0454  NA 136 140 140 139 139  K 4.0 3.7 3.6 3.5 4.7  CL 96* 99 98 96* 93*  CO2 31 33* 32 30 35*  GLUCOSE 98 114* 100* 140* 151*  BUN 12 17 12 8 10   CREATININE 0.68 0.60* 0.68 0.51* 0.61  CALCIUM 8.5* 8.6* 8.6* 8.4* 8.7*  MG 1.9 2.3 1.9 1.9 1.9  PHOS  --  2.6 3.1 3.5 3.9   GFR: Estimated Creatinine Clearance: 109.4 mL/min (by C-G formula based on SCr of 0.61 mg/dL). Liver Function Tests: Recent Labs  Lab 09/16/17 1234 09/17/17 0353  AST 44* 32  ALT 41 32  ALKPHOS 64 54  BILITOT 1.1 1.0  PROT 7.7 6.7  ALBUMIN 3.3* 2.8*   Recent Labs  Lab 09/16/17 1234 09/17/17 0353 09/20/17 1048  LIPASE 106* 40 22   No results for input(s): AMMONIA in the last 168 hours. Coagulation Profile: No results for input(s): INR,  PROTIME in the last 168 hours. Cardiac Enzymes: No results for input(s): CKTOTAL, CKMB, CKMBINDEX, TROPONINI in the last 168 hours. BNP (last 3 results) No results for input(s): PROBNP in the last 8760 hours. HbA1C: No results for input(s): HGBA1C in the last 72 hours. CBG: Recent Labs  Lab 09/16/17 1232 09/17/17 1155 09/17/17 1803  GLUCAP 99 66* 104*   Lipid Profile: No results for input(s): CHOL, HDL, LDLCALC, TRIG, CHOLHDL, LDLDIRECT in the last 72 hours. Thyroid Function Tests: No results for input(s): TSH, T4TOTAL, FREET4, T3FREE, THYROIDAB in the last 72 hours. Anemia Panel: Recent Labs    09/23/17 0820  FERRITIN 110  TIBC 236*  IRON 19*   Sepsis Labs: Recent Labs  Lab 09/16/17 1241 09/16/17 1440  LATICACIDVEN 2.93* 1.59    Recent Results (from the past 240 hour(s))  Urine culture     Status: None   Collection Time: 09/16/17 12:34 PM  Result Value Ref Range Status   Specimen Description   Final    URINE, RANDOM Performed at [facility], [place] [place], [place] [address]    Special Requests   Final    NONE Performed at [facility], [address] [place], [place] [address]    Culture   Final    NO GROWTH Performed at [facility], [place] [address] [place], [place] [address]    Report Status 09/17/2017 FINAL  Final  MRSA PCR Screening     Status: None   Collection Time: 09/16/17  4:45 PM  Result Value Ref Range Status   MRSA by PCR NEGATIVE NEGATIVE Final    Comment:        The GeneXpert MRSA Assay (FDA approved for NASAL specimens only), is one component of a comprehensive MRSA colonization surveillance program. It is not intended to diagnose MRSA infection nor to guide or monitor treatment for MRSA infections. Performed at [facility], [place] [address] [place], [place] [address]   Culture, blood (Routine X 2) w Reflex to ID Panel     Status: None (Preliminary result)   Collection Time:  09/18/17  4:29 PM  Result Value Ref Range Status   Specimen Description   Final    BLOOD LEFT ANTECUBITAL Performed at [facility] [address], [place], [place] [address]    Special Requests   Final    BOTTLES DRAWN AEROBIC AND ANAEROBIC Blood Culture adequate volume Performed at [facility] [address], [place], [place] [address]    Culture   Final    NO GROWTH 4 DAYS Performed at [facility], [place] [address], [place], [place] [address]    Report Status PENDING  Incomplete  Culture, blood (Routine X 2) w Reflex to ID Panel     Status: None (Preliminary result)   Collection Time: 09/18/17  4:36 PM  Result Value Ref Range Status   Specimen Description   Final    BLOOD LEFT HAND Performed at [facility] [address], [place], [place] [address]    Special Requests   Final    BOTTLES DRAWN AEROBIC ONLY Blood Culture results may not be optimal due to an inadequate volume of blood received in culture bottles Performed at [facility] [address], [place], [place] [address]    Culture   Final    NO GROWTH 4 DAYS Performed at [facility], [place] [address] [place],  [address]    Report Status PENDING  Incomplete       Radiology Studies: No results found.    Scheduled Meds: . amLODipine  10 mg Oral Daily  . chlorhexidine  15 mL Mouth Rinse BID  . enoxaparin (LOVENOX) injection  40 mg Subcutaneous Daily  . folic acid  1 mg Oral Daily  . lisinopril  20 mg Oral Daily  . mouth rinse  15 mL Mouth Rinse q12n4p  . metoprolol succinate  25 mg Oral Daily  . nicotine  14 mg Transdermal Daily  . tamsulosin  0.4 mg Oral Daily  . thiamine  100 mg Oral Daily   Continuous Infusions: . sodium chloride Stopped (09/22/17 1717)  . ferumoxytol    . piperacillin-tazobactam (ZOSYN)  IV 3.375 g (09/23/17 0914)  LOS: 7 days    Time spent: 25 minutes   Dessa Phi,  DO [facility] [email or web address] Password [email or web address] 09/23/2017, 12:19 PM

## 2017-09-24 ENCOUNTER — Inpatient Hospital Stay (HOSPITAL_COMMUNITY): Payer: Self-pay

## 2017-09-24 LAB — COMPREHENSIVE METABOLIC PANEL
ALT: 34 U/L (ref 0–44)
ANION GAP: 13 (ref 5–15)
AST: 45 U/L — ABNORMAL HIGH (ref 15–41)
Albumin: 2.6 g/dL — ABNORMAL LOW (ref 3.5–5.0)
Alkaline Phosphatase: 86 U/L (ref 38–126)
BUN: 13 mg/dL (ref 8–23)
CO2: 33 mmol/L — ABNORMAL HIGH (ref 22–32)
Calcium: 8.9 mg/dL (ref 8.9–10.3)
Chloride: 94 mmol/L — ABNORMAL LOW (ref 98–111)
Creatinine, Ser: 1.08 mg/dL (ref 0.61–1.24)
GFR calc Af Amer: >60 mL/min (ref >60)
Glucose, Bld: 199 mg/dL — ABNORMAL HIGH (ref 70–99)
POTASSIUM: 4.7 mmol/L (ref 3.5–5.1)
Sodium: 140 mmol/L (ref 135–145)
TOTAL PROTEIN: 6.6 g/dL (ref 6.5–8.1)
Total Bilirubin: 1.2 mg/dL (ref 0.3–1.2)

## 2017-09-24 LAB — GLUCOSE, CAPILLARY
GLUCOSE-CAPILLARY: 133 mg/dL — AB (ref 70–99)
GLUCOSE-CAPILLARY: 106 mg/dL — AB (ref 70–99)
Glucose-Capillary: 153 mg/dL — ABNORMAL HIGH (ref 70–99)
Glucose-Capillary: 180 mg/dL — ABNORMAL HIGH (ref 70–99)
Glucose-Capillary: 94 mg/dL (ref 70–99)
Glucose-Capillary: 135 mg/dL — ABNORMAL HIGH (ref 70–99)

## 2017-09-24 LAB — MAGNESIUM: MAGNESIUM: 2.2 mg/dL (ref 1.7–2.4)

## 2017-09-24 LAB — BLOOD GAS, ARTERIAL
ACID-BASE EXCESS: 7.8 mmol/L — AB (ref 0.0–2.0)
BICARBONATE: 36.9 mmol/L — AB (ref 20.0–28.0)
DRAWN BY: 225631
FIO2: 100
LHR: 18 {breaths}/min
MECHVT: 540 mL
O2 Saturation: 86.3 %
PEEP/CPAP: 8 cmH2O
Patient temperature: 96.4
pCO2 arterial: 82.6 mmHg (ref 32.0–48.0)
pH, Arterial: 7.265 — ABNORMAL LOW (ref 7.350–7.450)
pO2, Arterial: 51.8 mmHg — ABNORMAL LOW (ref 83.0–108.0)

## 2017-09-24 LAB — TROPONIN I: TROPONIN I: 0.03 ng/mL — AB (ref <0.03)

## 2017-09-24 LAB — CBC
HCT: 38.4 % — ABNORMAL LOW (ref 39.0–52.0)
HEMOGLOBIN: 10.8 g/dL — AB (ref 13.0–17.0)
MCH: 22.6 pg — ABNORMAL LOW (ref 26.0–34.0)
MCHC: 28.1 g/dL — AB (ref 30.0–36.0)
MCV: 80.3 fL (ref 78.0–100.0)
Platelets: 248 10*3/uL (ref 150–400)
RBC: 4.78 MIL/uL (ref 4.22–5.81)
RDW: 19 % — AB (ref 11.5–15.5)
WBC: 23 10*3/uL — ABNORMAL HIGH (ref 4.0–10.5)

## 2017-09-24 LAB — LACTIC ACID, PLASMA
LACTIC ACID, VENOUS: 2.9 mmol/L — AB (ref 0.5–1.9)
Lactic Acid, Venous: 2.4 mmol/L (ref 0.5–1.9)

## 2017-09-24 LAB — TRIGLYCERIDES: Triglycerides: 124 mg/dL

## 2017-09-24 LAB — PROCALCITONIN: PROCALCITONIN: 0.21 ng/mL

## 2017-09-24 LAB — CORTISOL: Cortisol, Plasma: 37.6 ug/dL

## 2017-09-24 LAB — TSH: TSH: 1.537 u[IU]/mL (ref 0.350–4.500)

## 2017-09-24 LAB — MRSA PCR SCREENING: MRSA BY PCR: NEGATIVE

## 2017-09-24 MED ORDER — FAMOTIDINE IN NACL 20-0.9 MG/50ML-% IV SOLN
20.0000 mg | INTRAVENOUS | Status: DC
  Administered 2017-09-25: 20 mg via INTRAVENOUS
  Filled 2017-09-24 (×2): qty 50

## 2017-09-24 MED ORDER — LIDOCAINE HCL URETHRAL/MUCOSAL 2 % EX GEL
1.0000 "application " | Freq: Once | CUTANEOUS | Status: AC
  Administered 2017-09-24: 1
  Filled 2017-09-24: qty 5

## 2017-09-24 MED ORDER — BUDESONIDE 0.25 MG/2ML IN SUSP
0.2500 mg | Freq: Two times a day (BID) | RESPIRATORY_TRACT | Status: DC
  Administered 2017-09-24: 0.25 mg via RESPIRATORY_TRACT
  Filled 2017-09-24: qty 2

## 2017-09-24 MED ORDER — PRO-STAT SUGAR FREE PO LIQD: 60.0000 mL | Freq: Two times a day (BID) | ORAL | Status: DC

## 2017-09-24 MED ORDER — DOCUSATE SODIUM 50 MG/5ML PO LIQD
100.0000 mg | Freq: Two times a day (BID) | ORAL | Status: DC | PRN
  Filled 2017-09-24: qty 10

## 2017-09-24 MED ORDER — FENTANYL 2500MCG IN NS 250ML (10MCG/ML) PREMIX INFUSION
25.0000 ug/h | INTRAVENOUS | Status: DC
  Administered 2017-09-24: 50 ug/h via INTRAVENOUS
  Filled 2017-09-24: qty 250

## 2017-09-24 MED ORDER — MIDAZOLAM HCL 2 MG/2ML IJ SOLN: 2.0000 mg | INTRAMUSCULAR | Status: DC | PRN

## 2017-09-24 MED ORDER — FENTANYL CITRATE (PF) 100 MCG/2ML IJ SOLN
50.0000 ug | Freq: Once | INTRAMUSCULAR | Status: AC
  Administered 2017-09-24: 50 ug via INTRAVENOUS

## 2017-09-24 MED ORDER — VITAMIN B-1 100 MG PO TABS: 100.0000 mg | ORAL_TABLET | Freq: Every day | ORAL | Status: DC

## 2017-09-24 MED ORDER — PHENYLEPHRINE HCL-NACL 10-0.9 MG/250ML-% IV SOLN
0.0000 ug/min | INTRAVENOUS | Status: DC
Start: 2017-09-24 — End: 2017-09-24
  Administered 2017-09-24: 20 ug/min via INTRAVENOUS
  Filled 2017-09-24: qty 250

## 2017-09-24 MED ORDER — FUROSEMIDE 10 MG/ML IJ SOLN
INTRAMUSCULAR | Status: AC
  Administered 2017-09-24: 40 mg
  Filled 2017-09-24: qty 4

## 2017-09-24 MED ORDER — IPRATROPIUM-ALBUTEROL 0.5-2.5 (3) MG/3ML IN SOLN: 3.0000 mL | RESPIRATORY_TRACT | Status: DC | PRN

## 2017-09-24 MED ORDER — INSULIN ASPART 100 UNIT/ML ~~LOC~~ SOLN
2.0000 [IU] | SUBCUTANEOUS | Status: DC
  Administered 2017-09-24 (×2): 2 [IU] via SUBCUTANEOUS
  Administered 2017-09-24: 4 [IU] via SUBCUTANEOUS
  Administered 2017-09-25 (×3): 2 [IU] via SUBCUTANEOUS

## 2017-09-24 MED ORDER — FENTANYL BOLUS VIA INFUSION
50.0000 ug | INTRAVENOUS | Status: DC | PRN
  Filled 2017-09-24: qty 50

## 2017-09-24 MED ORDER — VITAL HIGH PROTEIN PO LIQD: 1000.0000 mL | ORAL | Status: DC

## 2017-09-24 MED ORDER — BUTAMBEN-TETRACAINE-BENZOCAINE 2-2-14 % EX AERO
1.0000 | INHALATION_SPRAY | Freq: Once | CUTANEOUS | Status: AC
  Administered 2017-09-24: 1 via TOPICAL
  Filled 2017-09-24: qty 20

## 2017-09-24 MED ORDER — FAMOTIDINE IN NACL 20-0.9 MG/50ML-% IV SOLN
20.0000 mg | Freq: Two times a day (BID) | INTRAVENOUS | Status: DC
  Administered 2017-09-24: 20 mg via INTRAVENOUS
  Filled 2017-09-24: qty 50

## 2017-09-24 MED ORDER — PANTOPRAZOLE SODIUM 40 MG PO PACK: 40.0000 mg | PACK | ORAL | Status: DC

## 2017-09-24 MED ORDER — SODIUM CHLORIDE 0.9 % IV SOLN: INTRAVENOUS | Status: DC | PRN

## 2017-09-24 MED ORDER — NALOXONE HCL 0.4 MG/ML IJ SOLN
INTRAMUSCULAR | Status: AC
  Administered 2017-09-24: 04:00:00
  Filled 2017-09-24: qty 1

## 2017-09-24 MED ORDER — FOLIC ACID 1 MG PO TABS: 1.0000 mg | ORAL_TABLET | Freq: Every day | ORAL | Status: DC

## 2017-09-24 MED ORDER — FENTANYL CITRATE (PF) 100 MCG/2ML IJ SOLN
INTRAMUSCULAR | Status: AC
  Administered 2017-09-24: 50 ug via INTRAVENOUS
  Filled 2017-09-24: qty 2

## 2017-09-24 MED ORDER — FENTANYL CITRATE (PF) 100 MCG/2ML IJ SOLN
100.0000 ug | INTRAMUSCULAR | Status: DC | PRN
  Administered 2017-09-24 – 2017-09-25 (×6): 100 ug via INTRAVENOUS
  Filled 2017-09-24 (×6): qty 2

## 2017-09-24 MED ORDER — PROPOFOL 1000 MG/100ML IV EMUL
5.0000 ug/kg/min | INTRAVENOUS | Status: DC
Start: 2017-09-24 — End: 2017-09-24
  Administered 2017-09-24: 5 ug/kg/min via INTRAVENOUS
  Filled 2017-09-24: qty 100

## 2017-09-24 MED ORDER — IPRATROPIUM-ALBUTEROL 0.5-2.5 (3) MG/3ML IN SOLN
3.0000 mL | Freq: Four times a day (QID) | RESPIRATORY_TRACT | Status: DC
  Administered 2017-09-24: 3 mL via RESPIRATORY_TRACT
  Filled 2017-09-24: qty 3

## 2017-09-24 MED ORDER — ADULT MULTIVITAMIN LIQUID CH
15.0000 mL | Freq: Every day | ORAL | Status: DC
  Filled 2017-09-24 (×2): qty 15

## 2017-09-24 MED ORDER — DEXMEDETOMIDINE HCL IN NACL 200 MCG/50ML IV SOLN
0.0000 ug/kg/h | INTRAVENOUS | Status: DC
  Administered 2017-09-24 (×3): 0.4 ug/kg/h via INTRAVENOUS
  Administered 2017-09-24: 0.5 ug/kg/h via INTRAVENOUS
  Administered 2017-09-24 – 2017-09-25 (×2): 0.4 ug/kg/h via INTRAVENOUS
  Administered 2017-09-25: 0.6 ug/kg/h via INTRAVENOUS
  Administered 2017-09-25: 0.5 ug/kg/h via INTRAVENOUS
  Filled 2017-09-24 (×8): qty 50

## 2017-09-24 MED FILL — Medication: Qty: 1 | Status: AC

## 2017-09-24 NOTE — Progress Notes (Signed)
Spoke with Laurey Arrow B. NP in regards to NG/OG tube placement. Per NP, orders will be adjusted to IV if possible, others will be held. Will continue to monitor.

## 2017-09-24 NOTE — Progress Notes (Signed)
eLink Physician-Brief Progress Note Patient Name: Alex Torres DOB: [birth year] MRN: [id number]   Date of Service  09/24/2017  HPI/Events of Note  62 yo male s/p cardiac arrest on floor. Now with ROSC, intubated and ventilated. BP =  And HR = 112. CXR and ABG pending.  eICU Interventions  Will order: 1. Ventilator settings: 100%/PRVC 18/TV 540/P 8. 2. Propofol IV infusion. Titrate to RASS = 0 to -1.  3. SCD's. 4. Pepcid 20 mg IV now and Q 12 hours.      Intervention Category Major Interventions: Respiratory failure - evaluation and management  Sommer,Steven Eugene 09/24/2017, 4:30 AM

## 2017-09-24 NOTE — Progress Notes (Signed)
Pt HR dropped down to 37 and pt was observed in respiratory arrest. Code was  called and pt was resuscitated and sent to ICU. Attempts to contact son were unsuccessful. Individual that answered the phone number on file stated that I had the wrong number. No other numbers available.

## 2017-09-24 NOTE — Consult Note (Signed)
.. ..  Name: Alex Torres MRN: [id number] DOB: 1955/06/01    ADMISSION DATE:  09/16/2017 CONSULTATION DATE:  09/24/2017  REFERRING MD : Dr. Tamala Julian, [facility]  CHIEF COMPLAINT:  S/p cardiac arrest  BRIEF PATIENT DESCRIPTION: 62 yo male former smoker admitted with abdominal pain from alcoholic pancreatitis and alcohol withdrawal developed PEA cardiac arrest 8/05 with ROSC after about 10 minutes.  PAST MEDICAL HISTORY: Hypertension, Splenectomy 1972, Depression  SUBJECTIVE:  Remains on full vent support.  VITAL SIGNS: BP 115/70   Pulse 80   Temp (!) 100.9 F (38.3 C) (Axillary)   Resp (!) 22   Ht 5\' 9"  (1.753 m)   Wt 216 lb 0.8 oz (98 kg)   SpO2 98%   BMI 31.91 kg/m   INTAKE/OUTPUT: I/O last 3 completed shifts: In: 5465 [P.O.:240; I.V.:929.6; IV Piggyback:407.4] Out: 995 [Urine:995]  PHYSICAL EXAMINATION:  General - sedated Eyes - pupils reactive ENT - ETT in place Cardiac - regular rate/rhythm, no murmur Chest - decreased BS at Rt base, no wheeze Abdomen - soft, mild distention and tenderness, + BS GU - scrotal edema, foley in place Extremities - no cyanosis, clubbing, or edema Skin - no rashes Lymphatics - no lymphadenopathy Neuro - RASS -2, follows commands and moves all extremities   LABS: CBC Recent Labs    09/22/17 0524 09/23/17 0454 09/24/17 0405  WBC 20.8* 23.2* 23.0*  HGB 11.3* 10.5* 10.8*  HCT 37.9* 36.1* 38.4*  PLT 361 384 248    Coag's No results for input(s): APTT, INR in the last 72 hours.  BMET Recent Labs    09/22/17 0524 09/23/17 0454 09/24/17 0405  NA 139 139 140  K 3.5 4.7 4.7  CL 96* 93* 94*  CO2 30 35* 33*  BUN 8 10 13   CREATININE 0.51* 0.61 1.08  GLUCOSE 140* 151* 199*    Electrolytes Recent Labs    09/22/17 0524 09/23/17 0454 09/24/17 0405  CALCIUM 8.4* 8.7* 8.9  MG 1.9 1.9 2.2  PHOS 3.5 3.9  --     Sepsis Markers Recent Labs    09/24/17 0536  PROCALCITON 0.21    ABG Recent Labs    09/24/17 0455    PHART 7.265*  PCO2ART 82.6*  PO2ART 51.8*    Liver Enzymes Recent Labs    09/24/17 0405  AST 45*  ALT 34  ALKPHOS 86  BILITOT 1.2  ALBUMIN 2.6*    Cardiac Enzymes Recent Labs    09/24/17 0536  TROPONINI 0.03*    Glucose Recent Labs    09/24/17 0343 09/24/17 0532 09/24/17 0747  GLUCAP 153* 180* 133*    Imaging Dg Abd 1 View  Result Date: 09/24/2017 CLINICAL DATA:  Orogastric tube placement. EXAM: ABDOMEN - 1 VIEW COMPARISON:  CT of the chest performed 09/23/2017 FINDINGS: The orogastric tube is not visualized on this study, and may be coiled within the patient's mouth. The patient's endotracheal tube is seen ending 4-5 cm above the carina. The lungs are hypoexpanded, with bilateral atelectasis. No pleural effusion or pneumothorax is seen. The cardiomediastinal silhouette is borderline normal in size. No acute osseous abnormalities are identified. External pacing pads are noted. The visualized bowel gas pattern is grossly unremarkable. IMPRESSION: 1. Orogastric tube not visualized on this study; it may be coiled within the patient's mouth. 2. Endotracheal tube seen ending 4-5 cm above the carina. 3. Lungs hypoexpanded, with bilateral atelectasis. Electronically Signed   By: Garald Balding M.D.   On: 09/24/2017 06:44   Ct  Chest W Contrast  Result Date: 09/23/2017 CLINICAL DATA:  MD NOTES: Likely alcohol induced, CT scan consistent with acute pancreatitis, no evidence of necrotizing pancreatitis. Surrounding inflammatory changes to the duodenum. EXAM: CT CHEST, ABDOMEN, AND PELVIS WITH CONTRAST TECHNIQUE: Multidetector CT imaging of the chest, abdomen and pelvis was performed following the standard protocol during bolus administration of intravenous contrast. CONTRAST:  16mL ISOVUE-300 IOPAMIDOL (ISOVUE-300) INJECTION 61% COMPARISON:  CT of the abdomen and pelvis on 09/16/2017 and chest x-ray on 09/21/2017 FINDINGS: CT CHEST FINDINGS Cardiovascular: Heart size is normal. Coronary  artery calcifications are present. There is minimal atherosclerotic calcification of the thoracic aorta, not associated with aneurysm. Mediastinum/Nodes: The visualized portion of the thyroid gland has a normal appearance. LEFT thoracic inlet lymph node is 11 millimeters. No suspicious hilar or axillary adenopathy. Lungs/Pleura: There are bilateral pleural effusions. Bilateral LOWER lobe consolidations, RIGHT greater than LEFT. Musculoskeletal: Degenerative changes are seen in the spine. Remote rib fractures. CT ABDOMEN PELVIS FINDINGS Hepatobiliary: The liver is homogeneous.  Gallbladder is present. Pancreas: The pancreas is enlarged. There is significant. There is increased. Pancreatic fluid compared with most recent exam. No pseudocyst or abscess. There are small peripancreatic and mesenteric lymph nodes. Fluid extends into the peritoneal reflection inferiorly. Spleen: Splenectomy.  LEFT UPPER QUADRANT splenosis. Adrenals/Urinary Tract: Adrenal glands are normal in appearance. There is symmetric enhancement and excretion from both kidneys. No hydronephrosis. No suspicious renal mass. Urinary bladder is unremarkable. Stomach/Bowel: The stomach is normal in appearance. There is thickening of the second and third portion of the duodenal, likely secondary to pancreatitis. No small bowel obstruction or significant wall thickening. There are numerous colonic diverticula. No acute diverticulitis. The appendix is well seen and has a normal appearance. Vascular/Lymphatic: There is minimal atherosclerotic calcification of the abdominal aorta. No aneurysm. Although involved by atherosclerosis, there is vascular opacification of the celiac axis, superior mesenteric artery, and inferior mesenteric artery. Normal appearance of the portal venous system and inferior vena cava. Small central abdominal mesenteric lymph nodes are likely reactive. Portal vein and superior mesenteric vein are patent. Reproductive: Prostatic  calcifications. Other: No abdominal wall hernia.  No ascites.  Abdominal wall edema. Musculoskeletal: No acute or significant osseous findings. IMPRESSION: 1. Enlarged inflamed pancreas. No evidence for pancreatic necrosis, pseudocyst, or abscess. 2. Secondary inflammation of the duodenal. 3. Interval development of bilateral LOWER lobe consolidations and small effusions, RIGHT greater than LEFT. 4. Coronary artery disease. 5.  Aortic atherosclerosis.  (ICD10-I70.0) 6. Anasarca. 7. Colonic diverticulosis. Electronically Signed   By: Nolon Nations M.D.   On: 09/23/2017 16:46   Ct Abdomen Pelvis W Contrast  Result Date: 09/23/2017 CLINICAL DATA:  MD NOTES: Likely alcohol induced, CT scan consistent with acute pancreatitis, no evidence of necrotizing pancreatitis. Surrounding inflammatory changes to the duodenum. EXAM: CT CHEST, ABDOMEN, AND PELVIS WITH CONTRAST TECHNIQUE: Multidetector CT imaging of the chest, abdomen and pelvis was performed following the standard protocol during bolus administration of intravenous contrast. CONTRAST:  176mL ISOVUE-300 IOPAMIDOL (ISOVUE-300) INJECTION 61% COMPARISON:  CT of the abdomen and pelvis on 09/16/2017 and chest x-ray on 09/21/2017 FINDINGS: CT CHEST FINDINGS Cardiovascular: Heart size is normal. Coronary artery calcifications are present. There is minimal atherosclerotic calcification of the thoracic aorta, not associated with aneurysm. Mediastinum/Nodes: The visualized portion of the thyroid gland has a normal appearance. LEFT thoracic inlet lymph node is 11 millimeters. No suspicious hilar or axillary adenopathy. Lungs/Pleura: There are bilateral pleural effusions. Bilateral LOWER lobe consolidations, RIGHT greater than LEFT. Musculoskeletal: Degenerative  changes are seen in the spine. Remote rib fractures. CT ABDOMEN PELVIS FINDINGS Hepatobiliary: The liver is homogeneous.  Gallbladder is present. Pancreas: The pancreas is enlarged. There is significant. There is  increased. Pancreatic fluid compared with most recent exam. No pseudocyst or abscess. There are small peripancreatic and mesenteric lymph nodes. Fluid extends into the peritoneal reflection inferiorly. Spleen: Splenectomy.  LEFT UPPER QUADRANT splenosis. Adrenals/Urinary Tract: Adrenal glands are normal in appearance. There is symmetric enhancement and excretion from both kidneys. No hydronephrosis. No suspicious renal mass. Urinary bladder is unremarkable. Stomach/Bowel: The stomach is normal in appearance. There is thickening of the second and third portion of the duodenal, likely secondary to pancreatitis. No small bowel obstruction or significant wall thickening. There are numerous colonic diverticula. No acute diverticulitis. The appendix is well seen and has a normal appearance. Vascular/Lymphatic: There is minimal atherosclerotic calcification of the abdominal aorta. No aneurysm. Although involved by atherosclerosis, there is vascular opacification of the celiac axis, superior mesenteric artery, and inferior mesenteric artery. Normal appearance of the portal venous system and inferior vena cava. Small central abdominal mesenteric lymph nodes are likely reactive. Portal vein and superior mesenteric vein are patent. Reproductive: Prostatic calcifications. Other: No abdominal wall hernia.  No ascites.  Abdominal wall edema. Musculoskeletal: No acute or significant osseous findings. IMPRESSION: 1. Enlarged inflamed pancreas. No evidence for pancreatic necrosis, pseudocyst, or abscess. 2. Secondary inflammation of the duodenal. 3. Interval development of bilateral LOWER lobe consolidations and small effusions, RIGHT greater than LEFT. 4. Coronary artery disease. 5.  Aortic atherosclerosis.  (ICD10-I70.0) 6. Anasarca. 7. Colonic diverticulosis. Electronically Signed   By: Nolon Nations M.D.   On: 09/23/2017 16:46   Dg Chest Port 1 View  Result Date: 09/24/2017 CLINICAL DATA:  Endotracheal tube placement.  EXAM: PORTABLE CHEST 1 VIEW COMPARISON:  Chest radiograph performed 09/21/2017, and CT of the chest performed 09/23/2017 FINDINGS: The patient's endotracheal tube is seen ending 4 cm above the carina. A persistent small to moderate right-sided pleural effusion is noted. The lungs are hypoexpanded. Increased interstitial markings may reflect pulmonary edema. No pneumothorax is seen. The cardiomediastinal silhouette is borderline enlarged. No acute osseous abnormalities are identified. IMPRESSION: 1. Endotracheal tube seen ending 4 cm above the carina. 2. Persistent small to moderate right-sided pleural effusion. 3. Lungs hypoexpanded. Increased interstitial markings may reflect pulmonary edema. 4. Borderline cardiomegaly. Electronically Signed   By: Garald Balding M.D.   On: 09/24/2017 04:28     STUDIES: CT abd/pelvis 7/28 >> Rt lung ASD, acute pancreatitis, GB thickening Abd u/s 7/28 >> cholelithiasis w/o wall thickening or Murphy's sign Echo 7/29 >> EF 65 to 70%, PAS 62 mmHg CT angio chest 7/29 >> Chronic volume loss RML and RLL, small Rt effusion CT chest 8/05 >> Rt > Lt ASD CT abd/pelvis 8/05 >> inflamed pancrease, no necrosis/pseudocysts/abscess  CULTURES: Blood 7/30 >> negative Sputum 8/05  ANTIBIOTICS: Zosyn 7/30 >>  EVENTS: 7/28 Admit 7/30 Start Abx 8/05 Respiratory leading to cardiac arrest  LINES/TUBES: ETT 8/05 >>   DISCUSSION: 62 yo male former smoker with respiratory leading to PEA cardiac arrest in setting of HCAP and alcohol pancreatitis with withdrawal.  ASSESSMENT / PLAN:  Acute on chronic hypoxic, hypercapnic respiratory failure. - full vent support - f/u CXR, ABG - prn duoneb  Rt lobar pneumonia. - check sputum cx - day 7 of zosyn  PEA cardiac arrest from respiratory arrest. Prolonged QT. Pulmonary hypertension. Hx of HTN. - monitor hemodynamics - hold norvasc, toprol, lisinopril  Acute alcoholic pancreatitis. - trickle tube feeds  Acute metabolic  encephalopathy. Acute alcohol withdrawal. - RASS goal -1 to -2 - precedex with prn versed, fentanyl - thiamine, folic acid, MVI  Acute renal failure with ATN. - monitor renal fx, urine outpt - keep foley in for now  Anemia from critical illness, chronic disease, and iron deficiency. - f/u CBC  DVT prophylaxis - lovenox SUP - protonix Nutrition - trickle tube feeds Goals of care - full code  CC time 60 minutes  Chesley Mires, MD [place] 09/24/2017, 9:50 AM
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
## 2017-09-24 NOTE — Progress Notes (Signed)
Patient's son, Lennette Bihari, was contacted to update on patient's current status. Patient's son's phone number updated as emergency contact. Per patient's son, Lennette Bihari, he will attempt to visit later this afternoon.

## 2017-09-24 NOTE — Progress Notes (Signed)
This RN and charge RN have attempted to insert NG/OG without success. Will re-attempt once lidocaine jelly is available. Will continue to monitor.

## 2017-09-24 NOTE — Progress Notes (Signed)
PT Cancellation Note  Patient Details Name: Alex Torres MRN: [id number] DOB: [birth year]   Cancelled Treatment:    Reason Eval/Treat Not Completed: Medical issues which prohibited therapy--cardiac arrest 8/5 a.m. Currently intubated. Will sign off. Please reorder PT consult once medically ready to participate with PT. Thanks.    Weston Anna, MPT Pager: [phone]

## 2017-09-24 NOTE — Plan of Care (Signed)
Pt able to stand today with walker.

## 2017-09-24 NOTE — Progress Notes (Signed)
NG/OG tube placement attempted again by this RN and charge RN. Attempts unsuccessful. Will notify NP/MD for further orders. Will continue to monitor.
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
## 2017-09-24 NOTE — Progress Notes (Signed)
ABG results shown to Dr. Jearld Lesch M.D. while on ICU/SD unit, Fi02 >'d to 100% along with set rate >'d to 22, pending ABG order.

## 2017-09-24 NOTE — Progress Notes (Signed)
OT Cancellation Note  Patient Details Name: Alex Torres MRN: [id number] DOB: [birth year]   Cancelled Treatment:    Reason Eval/Treat Not Completed: Other (comment)   Medical issues which prohibited therapy--cardiac arrest 8/5 a.m. Currently intubated. Will sign off   Morrison, Thereasa Parkin 09/24/2017, 1:22 PM

## 2017-09-24 NOTE — Progress Notes (Signed)
CRITICAL VALUE ALERT  Critical Value:Lactic 2.9  Date & Time Notied:  09/24/17 0614  Provider Notified: Dr Oletta Darter  Orders Received/Actions taken: Repeat labs
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
## 2017-09-24 NOTE — Progress Notes (Signed)
258mL of fentanyl drip wasted in sink. Witnessed by Nydia Bouton, RN.

## 2017-09-24 NOTE — Progress Notes (Signed)
CRITICAL VALUE ALERT  Critical Value:  Lactic acid 2.4  Date & Time Notied:  09/24/2017 0906h  Provider Notified: Dr. Halford Chessman  Orders Received/Actions taken: Continue current treatment plan.

## 2017-09-25 ENCOUNTER — Inpatient Hospital Stay (HOSPITAL_COMMUNITY): Payer: Self-pay

## 2017-09-25 LAB — COMPREHENSIVE METABOLIC PANEL
ALK PHOS: 61 U/L (ref 38–126)
ALT: 30 U/L (ref 0–44)
AST: 35 U/L (ref 15–41)
Albumin: 2.1 g/dL — ABNORMAL LOW (ref 3.5–5.0)
Anion gap: 12 (ref 5–15)
BUN: 15 mg/dL (ref 8–23)
CHLORIDE: 96 mmol/L — AB (ref 98–111)
CO2: 30 mmol/L (ref 22–32)
Calcium: 8.5 mg/dL — ABNORMAL LOW (ref 8.9–10.3)
Creatinine, Ser: 0.9 mg/dL (ref 0.61–1.24)
GFR calc Af Amer: >60 mL/min (ref >60)
Glucose, Bld: 143 mg/dL — ABNORMAL HIGH (ref 70–99)
POTASSIUM: 3 mmol/L — AB (ref 3.5–5.1)
Sodium: 138 mmol/L (ref 135–145)
Total Bilirubin: 0.6 mg/dL (ref 0.3–1.2)
Total Protein: 5.9 g/dL — ABNORMAL LOW (ref 6.5–8.1)

## 2017-09-25 LAB — GLUCOSE, CAPILLARY
GLUCOSE-CAPILLARY: 122 mg/dL — AB (ref 70–99)
GLUCOSE-CAPILLARY: 105 mg/dL — AB (ref 70–99)
Glucose-Capillary: 139 mg/dL — ABNORMAL HIGH (ref 70–99)
Glucose-Capillary: 141 mg/dL — ABNORMAL HIGH (ref 70–99)

## 2017-09-25 LAB — CBC
HEMATOCRIT: 31.7 % — AB (ref 39.0–52.0)
Hemoglobin: 9.5 g/dL — ABNORMAL LOW (ref 13.0–17.0)
MCH: 22.5 pg — AB (ref 26.0–34.0)
MCHC: 30 g/dL (ref 30.0–36.0)
MCV: 74.9 fL — AB (ref 78.0–100.0)
Platelets: 428 10*3/uL — ABNORMAL HIGH (ref 150–400)
RBC: 4.23 MIL/uL (ref 4.22–5.81)
RDW: 18.6 % — ABNORMAL HIGH (ref 11.5–15.5)
WBC: 14.2 10*3/uL — AB (ref 4.0–10.5)

## 2017-09-25 LAB — BLOOD GAS, ARTERIAL
Acid-Base Excess: 9.1 mmol/L — ABNORMAL HIGH (ref 0.0–2.0)
Bicarbonate: 32 mmol/L — ABNORMAL HIGH (ref 20.0–28.0)
DRAWN BY: 225631
FIO2: 40
MECHVT: 540 mL
Mechanical Rate: 22
O2 SAT: 91.7 %
PCO2 ART: 37 mmHg (ref 32.0–48.0)
PEEP/CPAP: 8 cmH2O
PH ART: 7.548 — AB (ref 7.350–7.450)
PO2 ART: 58.9 mmHg — AB (ref 83.0–108.0)
Patient temperature: 99.7
RATE: 22 resp/min

## 2017-09-25 LAB — PROCALCITONIN: Procalcitonin: 0.14 ng/mL

## 2017-09-25 LAB — MAGNESIUM: Magnesium: 1.9 mg/dL (ref 1.7–2.4)

## 2017-09-25 LAB — PHOSPHORUS: Phosphorus: 1.9 mg/dL — ABNORMAL LOW (ref 2.5–4.6)

## 2017-09-25 MED ORDER — ORAL CARE MOUTH RINSE
15.0000 mL | OROMUCOSAL | Status: DC
  Administered 2017-09-25: 15 mL via OROMUCOSAL

## 2017-09-25 MED ORDER — IPRATROPIUM-ALBUTEROL 0.5-2.5 (3) MG/3ML IN SOLN
3.0000 mL | Freq: Four times a day (QID) | RESPIRATORY_TRACT | Status: DC
  Administered 2017-09-25 – 2017-09-26 (×4): 3 mL via RESPIRATORY_TRACT
  Filled 2017-09-25 (×4): qty 3

## 2017-09-25 MED ORDER — ACETAMINOPHEN 325 MG PO TABS
650.0000 mg | ORAL_TABLET | Freq: Four times a day (QID) | ORAL | Status: DC | PRN
  Administered 2017-09-25 – 2017-09-27 (×6): 650 mg via ORAL
  Filled 2017-09-25 (×6): qty 2

## 2017-09-25 MED ORDER — CHLORHEXIDINE GLUCONATE 0.12% ORAL RINSE (MEDLINE KIT)
15.0000 mL | Freq: Two times a day (BID) | OROMUCOSAL | Status: DC
  Administered 2017-09-25: 15 mL via OROMUCOSAL

## 2017-09-25 MED ORDER — KETOROLAC TROMETHAMINE 15 MG/ML IJ SOLN
7.5000 mg | Freq: Once | INTRAMUSCULAR | Status: AC
  Administered 2017-09-25: 7.5 mg via INTRAVENOUS
  Filled 2017-09-25: qty 1

## 2017-09-25 MED ORDER — POTASSIUM CHLORIDE CRYS ER 20 MEQ PO TBCR
40.0000 meq | EXTENDED_RELEASE_TABLET | Freq: Once | ORAL | Status: DC
Start: 2017-09-25 — End: 2017-09-25

## 2017-09-25 MED ORDER — FOLIC ACID 5 MG/ML IJ SOLN
1.0000 mg | Freq: Every day | INTRAMUSCULAR | Status: DC
  Administered 2017-09-25: 1 mg via INTRAVENOUS
  Filled 2017-09-25 (×2): qty 0.2

## 2017-09-25 MED ORDER — LABETALOL HCL 5 MG/ML IV SOLN
10.0000 mg | INTRAVENOUS | Status: DC | PRN
  Administered 2017-09-26: 10 mg via INTRAVENOUS
  Filled 2017-09-25: qty 4

## 2017-09-25 MED ORDER — ORAL CARE MOUTH RINSE
15.0000 mL | Freq: Two times a day (BID) | OROMUCOSAL | Status: DC
  Administered 2017-09-25 – 2017-09-28 (×4): 15 mL via OROMUCOSAL

## 2017-09-25 MED ORDER — ADULT MULTIVITAMIN LIQUID CH
15.0000 mL | Freq: Every day | ORAL | Status: DC
  Administered 2017-09-26: 15 mL via ORAL
  Filled 2017-09-25 (×2): qty 15

## 2017-09-25 MED ORDER — POTASSIUM CHLORIDE 10 MEQ/100ML IV SOLN
10.0000 meq | INTRAVENOUS | Status: AC
  Administered 2017-09-25 (×6): 10 meq via INTRAVENOUS
  Filled 2017-09-25 (×6): qty 100

## 2017-09-25 MED ORDER — THIAMINE HCL 100 MG/ML IJ SOLN
100.0000 mg | Freq: Every day | INTRAMUSCULAR | Status: DC
  Administered 2017-09-25: 100 mg via INTRAVENOUS
  Filled 2017-09-25 (×2): qty 2

## 2017-09-25 NOTE — Progress Notes (Signed)
eLink Physician-Brief Progress Note Patient Name: Alex Torres DOB: May 20, 1955 MRN: [id number]   Date of Service  09/25/2017  HPI/Events of Note  Hypokalemia 3.0  eICU Interventions  60 meq IV. Patient does not have gastric tube so couldn't order po.     Intervention Category Minor Interventions: Electrolytes abnormality - evaluation and management  Sharia Reeve 09/25/2017, 5:35 AM

## 2017-09-25 NOTE — Procedures (Signed)
Extubation Procedure Note  Patient Details:   Name: FREDY GLADU DOB: [birth year] MRN: [id number]   Airway Documentation:    Vent end date: (S) 09/25/17 Vent end time: (S) 1010   Evaluation  O2 sats: stable throughout Complications: No apparent complications Patient did tolerate procedure well. Bilateral Breath Sounds: Clear, Diminished   Yes ability to speak.  Baldwin Jamaica Nannette 09/25/2017, 10:14 AM   Positive cuff leak pre extubation. Placed on 4 lpm [place] post extubation- increased to 6 lpm for sp02 was <90%- MD aware.

## 2017-09-25 NOTE — Progress Notes (Signed)
.. ..  Name: Alex Torres MRN: [id number] DOB: September 20, 1955    ADMISSION DATE:  09/16/2017 CONSULTATION DATE:  09/24/2017  REFERRING MD : Dr. Tamala Julian, [facility]  CHIEF COMPLAINT:  S/p cardiac arrest  BRIEF PATIENT DESCRIPTION: 62 yo male former smoker admitted with abdominal pain from alcoholic pancreatitis and alcohol withdrawal developed PEA cardiac arrest 8/05 with ROSC after about 10 minutes.  Events/inteval   7/28 Admit 7/30 Start Abx 8/05 Respiratory leading to cardiac arrest/ PEA arrest with ROSC after 10 minutes, hemodynamically stable by the time of a.m. rounds.  Holding antihypertensives.Nursing staff having trouble placing nasogastric tube, this was placed on hold.  SUBJECTIVE:  He is awake, following commands.  On pressure support ventilation of 5/PEEP 5 with tidal volume in the 500 to 550 mL range.  Exhibiting no accessory use of this.  He is complaining of chest discomfort to palpation particularly over the left chest  VITAL SIGNS: Blood Pressure (Abnormal) 144/57   Pulse 67   Temperature 98.4 F (36.9 C) (Oral)   Respiration 20   Height 5\' 9"  (1.753 m)   Weight 216 lb 0.8 oz (98 kg)   Oxygen Saturation 93%   Body Mass Index 31.91 kg/m    INTAKE/OUTPUT:  Intake/Output Summary (Last 24 hours) at 09/25/2017 0904 Last data filed at 09/25/2017 6283 Gross per 24 hour  Intake 626.13 ml  Output 1210 ml  Net -583.87 ml     PHYSICAL EXAMINATION: General: This is a chronically ill-appearing disheveled 62 year old male.  He is sedated on the ventilator, with Precedex infusion.  Even with this he briskly follows commands.  Appears appropriate.  Appears comfortable. HEENT normocephalic atraumatic orally intubated no jugular venous distention, MMM Pulm: Diminished bases, no accessory use.  Occasional rhonchi that improved with suctioning Cardiac: Regular rate and rhythm without murmur rub or gallop Abdomen: Soft nontender Musculoskeletal:  left chest wall pain to palpation  otherwise equal strength and bulk this does appear to limit range of motion Neuro: Arousable, follows commands, moves all extremities GU: Very yellow     LABS: CBC Recent Labs    09/23/17 0454 09/24/17 0405 09/25/17 0331  WBC 23.2* 23.0* 14.2*  HGB 10.5* 10.8* 9.5*  HCT 36.1* 38.4* 31.7*  PLT 384 248 428*    Coag's No results for input(s): APTT, INR in the last 72 hours.  BMET Recent Labs    09/23/17 0454 09/24/17 0405 09/25/17 0331  NA 139 140 138  K 4.7 4.7 3.0*  CL 93* 94* 96*  CO2 35* 33* 30  BUN 10 13 15   CREATININE 0.61 1.08 0.90  GLUCOSE 151* 199* 143*    Electrolytes Recent Labs    09/23/17 0454 09/24/17 0405 09/25/17 0331  CALCIUM 8.7* 8.9 8.5*  MG 1.9 2.2 1.9  PHOS 3.9  --  1.9*    Sepsis Markers Recent Labs    09/24/17 0536 09/25/17 0331  PROCALCITON 0.21 0.14    ABG Recent Labs    09/24/17 0455 09/25/17 0530  PHART 7.265* 7.548*  PCO2ART 82.6* 37.0  PO2ART 51.8* 58.9*    Liver Enzymes Recent Labs    09/24/17 0405 09/25/17 0331  AST 45* 35  ALT 34 30  ALKPHOS 86 61  BILITOT 1.2 0.6  ALBUMIN 2.6* 2.1*    Cardiac Enzymes Recent Labs    09/24/17 0536  TROPONINI 0.03*    Glucose Recent Labs    09/24/17 1148 09/24/17 1615 09/24/17 2005 09/25/17 0006 09/25/17 0346 09/25/17 0802  GLUCAP 94 135* 106*  139* 141* 122*    Imaging Dg Abd 1 View  Result Date: 09/24/2017 CLINICAL DATA:  Orogastric tube placement. EXAM: ABDOMEN - 1 VIEW COMPARISON:  CT of the chest performed 09/23/2017 FINDINGS: The orogastric tube is not visualized on this study, and may be coiled within the patient's mouth. The patient's endotracheal tube is seen ending 4-5 cm above the carina. The lungs are hypoexpanded, with bilateral atelectasis. No pleural effusion or pneumothorax is seen. The cardiomediastinal silhouette is borderline normal in size. No acute osseous abnormalities are identified. External pacing pads are noted. The visualized bowel  gas pattern is grossly unremarkable. IMPRESSION: 1. Orogastric tube not visualized on this study; it may be coiled within the patient's mouth. 2. Endotracheal tube seen ending 4-5 cm above the carina. 3. Lungs hypoexpanded, with bilateral atelectasis. Electronically Signed   By: Garald Balding M.D.   On: 09/24/2017 06:44   Ct Chest W Contrast  Result Date: 09/23/2017 CLINICAL DATA:  MD NOTES: Likely alcohol induced, CT scan consistent with acute pancreatitis, no evidence of necrotizing pancreatitis. Surrounding inflammatory changes to the duodenum. EXAM: CT CHEST, ABDOMEN, AND PELVIS WITH CONTRAST TECHNIQUE: Multidetector CT imaging of the chest, abdomen and pelvis was performed following the standard protocol during bolus administration of intravenous contrast. CONTRAST:  132mL ISOVUE-300 IOPAMIDOL (ISOVUE-300) INJECTION 61% COMPARISON:  CT of the abdomen and pelvis on 09/16/2017 and chest x-ray on 09/21/2017 FINDINGS: CT CHEST FINDINGS Cardiovascular: Heart size is normal. Coronary artery calcifications are present. There is minimal atherosclerotic calcification of the thoracic aorta, not associated with aneurysm. Mediastinum/Nodes: The visualized portion of the thyroid gland has a normal appearance. LEFT thoracic inlet lymph node is 11 millimeters. No suspicious hilar or axillary adenopathy. Lungs/Pleura: There are bilateral pleural effusions. Bilateral LOWER lobe consolidations, RIGHT greater than LEFT. Musculoskeletal: Degenerative changes are seen in the spine. Remote rib fractures. CT ABDOMEN PELVIS FINDINGS Hepatobiliary: The liver is homogeneous.  Gallbladder is present. Pancreas: The pancreas is enlarged. There is significant. There is increased. Pancreatic fluid compared with most recent exam. No pseudocyst or abscess. There are small peripancreatic and mesenteric lymph nodes. Fluid extends into the peritoneal reflection inferiorly. Spleen: Splenectomy.  LEFT UPPER QUADRANT splenosis. Adrenals/Urinary  Tract: Adrenal glands are normal in appearance. There is symmetric enhancement and excretion from both kidneys. No hydronephrosis. No suspicious renal mass. Urinary bladder is unremarkable. Stomach/Bowel: The stomach is normal in appearance. There is thickening of the second and third portion of the duodenal, likely secondary to pancreatitis. No small bowel obstruction or significant wall thickening. There are numerous colonic diverticula. No acute diverticulitis. The appendix is well seen and has a normal appearance. Vascular/Lymphatic: There is minimal atherosclerotic calcification of the abdominal aorta. No aneurysm. Although involved by atherosclerosis, there is vascular opacification of the celiac axis, superior mesenteric artery, and inferior mesenteric artery. Normal appearance of the portal venous system and inferior vena cava. Small central abdominal mesenteric lymph nodes are likely reactive. Portal vein and superior mesenteric vein are patent. Reproductive: Prostatic calcifications. Other: No abdominal wall hernia.  No ascites.  Abdominal wall edema. Musculoskeletal: No acute or significant osseous findings. IMPRESSION: 1. Enlarged inflamed pancreas. No evidence for pancreatic necrosis, pseudocyst, or abscess. 2. Secondary inflammation of the duodenal. 3. Interval development of bilateral LOWER lobe consolidations and small effusions, RIGHT greater than LEFT. 4. Coronary artery disease. 5.  Aortic atherosclerosis.  (ICD10-I70.0) 6. Anasarca. 7. Colonic diverticulosis. Electronically Signed   By: Nolon Nations M.D.   On: 09/23/2017 16:46   Ct  Abdomen Pelvis W Contrast  Result Date: 09/23/2017 CLINICAL DATA:  MD NOTES: Likely alcohol induced, CT scan consistent with acute pancreatitis, no evidence of necrotizing pancreatitis. Surrounding inflammatory changes to the duodenum. EXAM: CT CHEST, ABDOMEN, AND PELVIS WITH CONTRAST TECHNIQUE: Multidetector CT imaging of the chest, abdomen and pelvis was  performed following the standard protocol during bolus administration of intravenous contrast. CONTRAST:  149mL ISOVUE-300 IOPAMIDOL (ISOVUE-300) INJECTION 61% COMPARISON:  CT of the abdomen and pelvis on 09/16/2017 and chest x-ray on 09/21/2017 FINDINGS: CT CHEST FINDINGS Cardiovascular: Heart size is normal. Coronary artery calcifications are present. There is minimal atherosclerotic calcification of the thoracic aorta, not associated with aneurysm. Mediastinum/Nodes: The visualized portion of the thyroid gland has a normal appearance. LEFT thoracic inlet lymph node is 11 millimeters. No suspicious hilar or axillary adenopathy. Lungs/Pleura: There are bilateral pleural effusions. Bilateral LOWER lobe consolidations, RIGHT greater than LEFT. Musculoskeletal: Degenerative changes are seen in the spine. Remote rib fractures. CT ABDOMEN PELVIS FINDINGS Hepatobiliary: The liver is homogeneous.  Gallbladder is present. Pancreas: The pancreas is enlarged. There is significant. There is increased. Pancreatic fluid compared with most recent exam. No pseudocyst or abscess. There are small peripancreatic and mesenteric lymph nodes. Fluid extends into the peritoneal reflection inferiorly. Spleen: Splenectomy.  LEFT UPPER QUADRANT splenosis. Adrenals/Urinary Tract: Adrenal glands are normal in appearance. There is symmetric enhancement and excretion from both kidneys. No hydronephrosis. No suspicious renal mass. Urinary bladder is unremarkable. Stomach/Bowel: The stomach is normal in appearance. There is thickening of the second and third portion of the duodenal, likely secondary to pancreatitis. No small bowel obstruction or significant wall thickening. There are numerous colonic diverticula. No acute diverticulitis. The appendix is well seen and has a normal appearance. Vascular/Lymphatic: There is minimal atherosclerotic calcification of the abdominal aorta. No aneurysm. Although involved by atherosclerosis, there is  vascular opacification of the celiac axis, superior mesenteric artery, and inferior mesenteric artery. Normal appearance of the portal venous system and inferior vena cava. Small central abdominal mesenteric lymph nodes are likely reactive. Portal vein and superior mesenteric vein are patent. Reproductive: Prostatic calcifications. Other: No abdominal wall hernia.  No ascites.  Abdominal wall edema. Musculoskeletal: No acute or significant osseous findings. IMPRESSION: 1. Enlarged inflamed pancreas. No evidence for pancreatic necrosis, pseudocyst, or abscess. 2. Secondary inflammation of the duodenal. 3. Interval development of bilateral LOWER lobe consolidations and small effusions, RIGHT greater than LEFT. 4. Coronary artery disease. 5.  Aortic atherosclerosis.  (ICD10-I70.0) 6. Anasarca. 7. Colonic diverticulosis. Electronically Signed   By: Nolon Nations M.D.   On: 09/23/2017 16:46   Dg Chest Port 1 View  Result Date: 09/25/2017 CLINICAL DATA:  62 year old male with respiratory failure. Subsequent encounter. EXAM: PORTABLE CHEST 1 VIEW COMPARISON:  09/24/2017 chest x-ray.  09/23/2017 chest CT. FINDINGS: Endotracheal tube tip 4.7 cm above the carina. Right lung apex not entirely included on present exam. No obvious pneumothorax. Cardiomegaly. Interval improved aeration right lung with persistent bilateral lower lobe consolidation and pleural effusions, greater on the right. Pulmonary vascular congestion greatest centrally. IMPRESSION: 1. Interval improved aeration right lung. Persistent bilateral lower lobe consolidation and pleural effusions, greater on right. 2. Pulmonary vascular congestion greatest centrally. 3. Cardiomegaly. Electronically Signed   By: Genia Del M.D.   On: 09/25/2017 06:48   Dg Chest Port 1 View  Result Date: 09/24/2017 CLINICAL DATA:  Endotracheal tube placement. EXAM: PORTABLE CHEST 1 VIEW COMPARISON:  Chest radiograph performed 09/21/2017, and CT of the chest performed  09/23/2017 FINDINGS:  The patient's endotracheal tube is seen ending 4 cm above the carina. A persistent small to moderate right-sided pleural effusion is noted. The lungs are hypoexpanded. Increased interstitial markings may reflect pulmonary edema. No pneumothorax is seen. The cardiomediastinal silhouette is borderline enlarged. No acute osseous abnormalities are identified. IMPRESSION: 1. Endotracheal tube seen ending 4 cm above the carina. 2. Persistent small to moderate right-sided pleural effusion. 3. Lungs hypoexpanded. Increased interstitial markings may reflect pulmonary edema. 4. Borderline cardiomegaly. Electronically Signed   By: Garald Balding M.D.   On: 09/24/2017 04:28     STUDIES: CT abd/pelvis 7/28 >> Rt lung ASD, acute pancreatitis, GB thickening Abd u/s 7/28 >> cholelithiasis w/o wall thickening or Murphy's sign Echo 7/29 >> EF 65 to 70%, PAS 62 mmHg CT angio chest 7/29 >> Chronic volume loss RML and RLL, small Rt effusion CT chest 8/05 >> Rt > Lt ASD CT abd/pelvis 8/05 >> inflamed pancrease, no necrosis/pseudocysts/abscess  CULTURES: Blood 7/30 >> negative Sputum 8/05: Rare WBCs, rare gram-positive rods in pairs and chains>>>  ANTIBIOTICS: Zosyn 7/30 >>   LINES/TUBES: ETT 8/05 >>     ASSESSMENT / PLAN:  Acute on chronic hypoxic, hypercapnic respiratory failure due to adverse reaction to sedating medications, in setting of OSA/OHS with worsening hypoventilation, and atelectasis superimposed on hcap.  -There may also be an element of underlying obstructive lung disease -Intubated on 8/5 status post cardiac arrest, had received oxycodone earlier, then subsequently had been refusing noninvasive ventilation -Portable chest x-ray personally reviewed on 8/6:Endotracheal tubes in satisfactory position.  Low volume film.  Left basilar volume loss, looks like mix of atelectasis and effusion.  Also has right-sided volume loss, however aeration is remarkably improved when  comparing films from 8/5. Plan RASS goal 0 Spontaneous breathing trial  scheduled bronchodilators No narcotics At bedtime BiPAP A.m. ABG prior to discharge, looking for hypercarbia Needs outpatient pulmonary follow-up, would benefit from PFTs once recovered from acute illness Today is day 8 Zosyn, white blood cell count continues to improve.  No marked fever spike.  Goal will be to complete 10 days total therapy   PEA cardiac arrest from respiratory arrest. Prolonged QT. Pulmonary hypertension. Hx of HTN. -Hemodynamically stable overnight Plan Continue monitor blood pressure Check QTC once a shift Holding Norvasc, Toprol, and lisinopril.  Chest pain status post CPR.  There is no obvious rib fracture  plan We will add low-dose NSAID If QTC normal could place Lidoderm patch Avoid narcotics given history  Acute metabolic encephalopathy. Acute alcohol withdrawal. -As of 8/4 was calm and appropriate.  Had been weaned off Precedex, and was tapering off very low dosing of Librium and doing well Plan Wean Precedex, RASS goal 0 Continue thiamine and folate No narcotics Could resume low-dose Librium if needed  Hypokalemia Plan Replaced, will recheck.  Acute alcoholic pancreatitis. -Lipase normalized Plan Advance diet as able  Anemia from critical illness, chronic disease, and iron deficiency. Plan Trend CBC   DVT prophylaxis - lovenox SUP - protonix Nutrition -n.p.o. Goals of care - full code  DISCUSSION: 62 yo male former smoker with respiratory leading to PEA cardiac arrest in setting of adverse reaction to narcotics superimposed on hcap, chronic hypercarbia.  He is withdrawal had been significantly improved prior to this event.  He appears to be very sensitive to any controlled substance.  I also wonder about underlying obstructive airway disease.  Today the plan will be to continue spontaneous breathing trial with goal to extubate.  We will titrate Precedex  for RASS  goal of 0.  I suspect this will be weaned off soon.  As far as pain we will try nonsteroidal anti-inflammatories, and if his QTC will allow it we will place a Lidoderm patch.  We should avoid narcotics.  If there is any concern about withdrawal he did not tolerate low-dose Librium in the past, I think this could be resumed.  We have had difficulty placing nasogastric tube, hopefully once extubated he can take medications via oral route.  Erick Colace ACNP-BC [place] Pager # [phone] OR # [phone] if no answer

## 2017-09-26 DIAGNOSIS — J9601 Acute respiratory failure with hypoxia: Secondary | ICD-10-CM

## 2017-09-26 LAB — COMPREHENSIVE METABOLIC PANEL
ALBUMIN: 2.2 g/dL — AB (ref 3.5–5.0)
ALK PHOS: 62 U/L (ref 38–126)
ALT: 28 U/L (ref 0–44)
AST: 35 U/L (ref 15–41)
Anion gap: 10 (ref 5–15)
BUN: 9 mg/dL (ref 8–23)
CHLORIDE: 100 mmol/L (ref 98–111)
CO2: 29 mmol/L (ref 22–32)
Calcium: 8.2 mg/dL — ABNORMAL LOW (ref 8.9–10.3)
Creatinine, Ser: 0.7 mg/dL (ref 0.61–1.24)
GFR calc Af Amer: >60 mL/min (ref >60)
GFR calc non Af Amer: >60 mL/min (ref >60)
GLUCOSE: 142 mg/dL — AB (ref 70–99)
POTASSIUM: 3.2 mmol/L — AB (ref 3.5–5.1)
SODIUM: 139 mmol/L (ref 135–145)
Total Bilirubin: 0.4 mg/dL (ref 0.3–1.2)
Total Protein: 6.1 g/dL — ABNORMAL LOW (ref 6.5–8.1)

## 2017-09-26 LAB — CBC
HCT: 31.1 % — ABNORMAL LOW (ref 39.0–52.0)
HEMOGLOBIN: 9.6 g/dL — AB (ref 13.0–17.0)
MCH: 22.6 pg — AB (ref 26.0–34.0)
MCHC: 30.9 g/dL (ref 30.0–36.0)
MCV: 73.2 fL — AB (ref 78.0–100.0)
Platelets: 466 10*3/uL — ABNORMAL HIGH (ref 150–400)
RBC: 4.25 MIL/uL (ref 4.22–5.81)
RDW: 18.4 % — ABNORMAL HIGH (ref 11.5–15.5)
WBC: 10.6 10*3/uL — ABNORMAL HIGH (ref 4.0–10.5)

## 2017-09-26 LAB — CULTURE, RESPIRATORY W GRAM STAIN: Culture: NORMAL

## 2017-09-26 LAB — CULTURE, RESPIRATORY

## 2017-09-26 LAB — PROCALCITONIN: PROCALCITONIN: 0.2 ng/mL

## 2017-09-26 LAB — LIPASE, BLOOD: Lipase: 39 U/L (ref 11–51)

## 2017-09-26 MED ORDER — LIDOCAINE 5 % EX PTCH
1.0000 | MEDICATED_PATCH | CUTANEOUS | Status: DC
  Administered 2017-09-26 – 2017-09-29 (×4): 1 via TRANSDERMAL
  Filled 2017-09-26 (×4): qty 1

## 2017-09-26 MED ORDER — METOPROLOL SUCCINATE ER 25 MG PO TB24
25.0000 mg | ORAL_TABLET | Freq: Every day | ORAL | Status: DC
  Administered 2017-09-26 – 2017-09-29 (×4): 25 mg via ORAL
  Filled 2017-09-26 (×4): qty 1

## 2017-09-26 MED ORDER — IPRATROPIUM-ALBUTEROL 0.5-2.5 (3) MG/3ML IN SOLN
3.0000 mL | Freq: Four times a day (QID) | RESPIRATORY_TRACT | Status: DC
  Administered 2017-09-26 – 2017-09-27 (×2): 3 mL via RESPIRATORY_TRACT
  Filled 2017-09-26 (×2): qty 3

## 2017-09-26 MED ORDER — POTASSIUM CHLORIDE 20 MEQ/15ML (10%) PO SOLN
40.0000 meq | ORAL | Status: AC
  Administered 2017-09-26 (×2): 40 meq via ORAL
  Filled 2017-09-26 (×2): qty 30

## 2017-09-26 MED ORDER — VITAMIN B-1 100 MG PO TABS
100.0000 mg | ORAL_TABLET | Freq: Every day | ORAL | Status: DC
  Administered 2017-09-26 – 2017-09-29 (×4): 100 mg via ORAL
  Filled 2017-09-26 (×4): qty 1

## 2017-09-26 MED ORDER — FOLIC ACID 1 MG PO TABS
1.0000 mg | ORAL_TABLET | Freq: Every day | ORAL | Status: DC
  Administered 2017-09-26 – 2017-09-29 (×4): 1 mg via ORAL
  Filled 2017-09-26 (×4): qty 1

## 2017-09-26 MED ORDER — AMLODIPINE BESYLATE 10 MG PO TABS
10.0000 mg | ORAL_TABLET | Freq: Every day | ORAL | Status: DC
  Administered 2017-09-26 – 2017-09-29 (×4): 10 mg via ORAL
  Filled 2017-09-26 (×4): qty 1

## 2017-09-26 MED ORDER — IBUPROFEN 200 MG PO TABS
600.0000 mg | ORAL_TABLET | Freq: Four times a day (QID) | ORAL | Status: DC | PRN
Start: 2017-09-26 — End: 2017-09-29
  Administered 2017-09-27 – 2017-09-29 (×5): 600 mg via ORAL
  Filled 2017-09-26 (×6): qty 3

## 2017-09-26 MED ORDER — FLEET ENEMA 7-19 GM/118ML RE ENEM: 1.0000 | ENEMA | Freq: Every day | RECTAL | Status: DC | PRN

## 2017-09-26 NOTE — Progress Notes (Addendum)
.. ..  Name: Alex Torres MRN: [id number] DOB: 10/07/1955    ADMISSION DATE:  09/16/2017 CONSULTATION DATE:  09/24/2017  REFERRING MD : Dr. Tamala Julian, [facility]  CHIEF COMPLAINT:  S/p cardiac arrest  BRIEF PATIENT DESCRIPTION: 62 yo male former smoker admitted with abdominal pain from alcoholic pancreatitis and alcohol withdrawal developed PEA cardiac arrest 8/05 with ROSC after about 10 minutes.  Events/inteval   7/28 Admit 7/30 Start Abx 8/05 Respiratory leading to cardiac arrest/ PEA arrest with ROSC after 10 minutes, hemodynamically stable by the time of a.m. rounds.  Holding antihypertensives.Nursing staff having trouble placing nasogastric tube, this was placed on hold. 8/6: Extubated to nasal cannula.  Slowly progressing.  Complaining of sternal chest discomfort SUBJECTIVE:  Intermittently using BiPAP.  He thinks he used it for about three quarters of the evening.  This morning he is breathing okay.  He denies shortness of breath.  He says he feels quite weak. VITAL SIGNS: Blood Pressure (Abnormal) 199/95   Pulse 100   Temperature 98.2 F (36.8 C) (Oral)   Respiration 20   Height 5\' 9"  (1.753 m)   Weight 216 lb 0.8 oz (98 kg)   Oxygen Saturation 93%   Body Mass Index 31.91 kg/m   INTAKE/OUTPUT:  Intake/Output Summary (Last 24 hours) at 09/26/2017 0859 Last data filed at 09/26/2017 2671 Gross per 24 hour  Intake 709.42 ml  Output 2285 ml  Net -1575.58 ml     PHYSICAL EXAMINATION: General: This is a pleasant 62 year old male patient currently in no acute distress he sitting up in bed. HEENT normocephalic atraumatic mucous membranes moist Cardiac: Regular rate and rhythm Pulmonary/Thoracics: Sternal chest discomfort noted.  Diminished bases, no significant cough or rhonchi Abdomen: Soft nontender Extremities: Brisk cap refill Neuro: Awake oriented no focal deficits     LABS: CBC Recent Labs    09/24/17 0405 09/25/17 0331 09/26/17 0332  WBC 23.0* 14.2* 10.6*    HGB 10.8* 9.5* 9.6*  HCT 38.4* 31.7* 31.1*  PLT 248 428* 466*    Coag's No results for input(s): APTT, INR in the last 72 hours.  BMET Recent Labs    09/24/17 0405 09/25/17 0331 09/26/17 0332  NA 140 138 139  K 4.7 3.0* 3.2*  CL 94* 96* 100  CO2 33* 30 29  BUN 13 15 9   CREATININE 1.08 0.90 0.70  GLUCOSE 199* 143* 142*    Electrolytes Recent Labs    09/24/17 0405 09/25/17 0331 09/26/17 0332  CALCIUM 8.9 8.5* 8.2*  MG 2.2 1.9  --   PHOS  --  1.9*  --     Sepsis Markers Recent Labs    09/24/17 0536 09/25/17 0331 09/26/17 0332  PROCALCITON 0.21 0.14 0.20    ABG Recent Labs    09/24/17 0455 09/25/17 0530  PHART 7.265* 7.548*  PCO2ART 82.6* 37.0  PO2ART 51.8* 58.9*    Liver Enzymes Recent Labs    09/24/17 0405 09/25/17 0331 09/26/17 0332  AST 45* 35 35  ALT 34 30 28  ALKPHOS 86 61 62  BILITOT 1.2 0.6 0.4  ALBUMIN 2.6* 2.1* 2.2*    Cardiac Enzymes Recent Labs    09/24/17 0536  TROPONINI 0.03*    Glucose Recent Labs    09/24/17 1615 09/24/17 2005 09/25/17 0006 09/25/17 0346 09/25/17 0802 09/25/17 1231  GLUCAP 135* 106* 139* 141* 122* 105*    Imaging Dg Chest Port 1 View  Result Date: 09/25/2017 CLINICAL DATA:  62 year old male with respiratory failure. Subsequent encounter. EXAM:  PORTABLE CHEST 1 VIEW COMPARISON:  09/24/2017 chest x-ray.  09/23/2017 chest CT. FINDINGS: Endotracheal tube tip 4.7 cm above the carina. Right lung apex not entirely included on present exam. No obvious pneumothorax. Cardiomegaly. Interval improved aeration right lung with persistent bilateral lower lobe consolidation and pleural effusions, greater on the right. Pulmonary vascular congestion greatest centrally. IMPRESSION: 1. Interval improved aeration right lung. Persistent bilateral lower lobe consolidation and pleural effusions, greater on right. 2. Pulmonary vascular congestion greatest centrally. 3. Cardiomegaly. Electronically Signed   By: Genia Del  M.D.   On: 09/25/2017 06:48     STUDIES: CT abd/pelvis 7/28 >> Rt lung ASD, acute pancreatitis, GB thickening Abd u/s 7/28 >> cholelithiasis w/o wall thickening or Murphy's sign Echo 7/29 >> EF 65 to 70%, PAS 62 mmHg CT angio chest 7/29 >> Chronic volume loss RML and RLL, small Rt effusion CT chest 8/05 >> Rt > Lt ASD CT abd/pelvis 8/05 >> inflamed pancrease, no necrosis/pseudocysts/abscess  CULTURES: Blood 7/30 >> negative Sputum 8/05: Rare WBCs, rare gram-positive rods in pairs and chains>>>  ANTIBIOTICS: Zosyn 7/30 >>   LINES/TUBES: ETT 8/05 >> 8/6    ASSESSMENT / PLAN: Resolved issues this hospital admit: Alcohol withdrawal and delirium tremens ( initial problem ) acute encephalopathy 2/2 Sedation secondary to medications (secondary problem) Alcohol related pancreatitis PEA arrest   Active issues:  Acute on chronic hypoxic, hypercapnic respiratory failure due to adverse reaction to sedating medications, in setting of OSA/OHS with worsening hypoventilation, and atelectasis superimposed on hcap.  -There may also be an element of underlying obstructive lung disease -Successfully extubated on 8/6.  Seems to be improving slowly. Plan Mobilize, get PT consult. Wean oxygen, continue pulse oximetry Continued scheduled bronchodilators Absolutely no narcotics, limit sedating medications Continue BiPAP at at bedtime We need to repeat arterial blood gas, but should do this as he gets closer to discharge.  Need to determine if he has baseline hypercarbia, if so he may be a candidate for [facility] however he has no insurance so I am not sure how he would obtain this.   He will need outpatient pulmonary follow-up, we need to get pulmonary function testing on him after his chest discomfort improves and he is recovered from this illness  He is day #9 of 10 Zosyn   PEA cardiac arrest from respiratory arrest. Prolonged QT. Pulmonary hypertension. Hx of HTN. -Hemodynamically stable  overnight Plan Continue to monitor blood pressure Norvasc and Toprol, holding ACE inhibitor for now but this may be able to be resumed soon Check QTC,   Chest pain status post CPR.  There is no obvious rib fracture  plan Adding PRN Motrin Add Lidoderm patch   Hypokalemia Plan Replace and recheck  History of alcohol abuse Plan Continue cessation  Anemia from critical illness, chronic disease, and iron deficiency. Plan Trend CBC  Physical deconditioning Plan PT consult Mobilize  Constipation Plan Fleets enema   DVT prophylaxis - lovenox SUP - protonix Nutrition -advance to regular Goals of care - full code Disposition: He can be transferred to telemetry floor  DISCUSSION: 62 yo male former smoker with respiratory leading to PEA cardiac arrest in setting of adverse reaction to narcotics superimposed on hcap, chronic hypercarbia and probable undiagnosed OHS OSA.  His  withdrawal had been significantly improved prior to this event.  He appears to be very sensitive to any controlled substance.  I also wonder about underlying obstructive airway disease.   He was successfully extubated on/6.  His delirium has  resolved.  His pulmonary status is improved.  At this point his active issues remain: Residual acute on chronic respiratory failure: this is improving, need to get arterial blood gas as we get closer to discharge to determine baseline hypercarbia, continue nocturnal BiPAP Probable underlying COPD: Continue bronchodilators but he will eventually need PFTs as an outpatient. Hcap: has 1 more day of antibiotics Profound deconditioning following critical illness: Needs PT consult Chest pain: Continue nonsteroidal anti-inflammatories Recent alcoholic pancreatitis, DTs, and withdrawal: Alcohol cessation Pretension: Adding back antihypertensives. In addition to the above issues he has no insurance, this could be challenging in regards to getting him nocturnal noninvasive  ventilation if indeed required  We will transfer him to telemetry  Erick Colace ACNP-BC [place] Pager # [phone] OR # [phone] if no answer

## 2017-09-26 NOTE — Progress Notes (Signed)
Called by PCCM for transfer of patient to [facility] service on 8/8.  Patient with h/o HTN and ETOH dependence who presented 7/28 with acute pancreatitis and ETOH withdrawal (admitted to [facility] service).  He is very sensitive to controlled substances and developed acute on chronic respiratory failure on BIPAP intermittently.  Improved withdrawal on Precedex and then got aspiration PNA.  He got oxycodone, removed BIPAP, and developed a PEA arrest.  He did not get cooled because he did well with ROSC.  Source of respiratory failure thought to be atelectasis.  He was intubated on 8/5, extubated 8/6.  -Delirium resolved -Pulm day 9/10 of Zosyn for aspiration/HCAP.  Likely with underlying COPD and OSA/OHS.  Plan for outpatient PFTs eventually.  Will need ABG in AM prior to d/c to assess for hypercarbia. -No insurance, SW consult. -Deconditioning, PT consult. -Resume home meds and determine what meds he will need at home.  Will transfer to [facility] service on telemetry with transfer of care on 8/8.  Carlyon Shadow, M.D.

## 2017-09-26 NOTE — Progress Notes (Signed)
SBAR REPORT GIVEN TO KAITLYN, RN IN 4 EAST  WITH ALLOWANCE FOR ALL QUESTIONS TO BE ASKED AND ANSWERED.

## 2017-09-27 DIAGNOSIS — F10230 Alcohol dependence with withdrawal, uncomplicated: Secondary | ICD-10-CM

## 2017-09-27 LAB — BASIC METABOLIC PANEL
ANION GAP: 8 (ref 5–15)
BUN: 7 mg/dL — ABNORMAL LOW (ref 8–23)
CALCIUM: 8.8 mg/dL — AB (ref 8.9–10.3)
CO2: 29 mmol/L (ref 22–32)
Chloride: 105 mmol/L (ref 98–111)
Creatinine, Ser: 0.71 mg/dL (ref 0.61–1.24)
GLUCOSE: 126 mg/dL — AB (ref 70–99)
POTASSIUM: 3.4 mmol/L — AB (ref 3.5–5.1)
SODIUM: 142 mmol/L (ref 135–145)

## 2017-09-27 LAB — CBC
HCT: 34.1 % — ABNORMAL LOW (ref 39.0–52.0)
HEMOGLOBIN: 10.3 g/dL — AB (ref 13.0–17.0)
MCH: 22.3 pg — ABNORMAL LOW (ref 26.0–34.0)
MCHC: 30.2 g/dL (ref 30.0–36.0)
MCV: 73.8 fL — ABNORMAL LOW (ref 78.0–100.0)
Platelets: 507 10*3/uL — ABNORMAL HIGH (ref 150–400)
RBC: 4.62 MIL/uL (ref 4.22–5.81)
RDW: 18.8 % — ABNORMAL HIGH (ref 11.5–15.5)
WBC: 11.9 10*3/uL — AB (ref 4.0–10.5)

## 2017-09-27 MED ORDER — BOOST / RESOURCE BREEZE PO LIQD CUSTOM
1.0000 | Freq: Two times a day (BID) | ORAL | Status: DC
  Administered 2017-09-27 – 2017-09-29 (×2): 1 via ORAL
  Filled 2017-09-27: qty 1

## 2017-09-27 MED ORDER — POTASSIUM CHLORIDE 20 MEQ/15ML (10%) PO SOLN
40.0000 meq | Freq: Once | ORAL | Status: AC
  Administered 2017-09-27: 40 meq via ORAL
  Filled 2017-09-27: qty 30

## 2017-09-27 MED ORDER — IPRATROPIUM-ALBUTEROL 0.5-2.5 (3) MG/3ML IN SOLN
3.0000 mL | Freq: Two times a day (BID) | RESPIRATORY_TRACT | Status: DC
  Administered 2017-09-27 – 2017-09-28 (×3): 3 mL via RESPIRATORY_TRACT
  Filled 2017-09-27 (×5): qty 3

## 2017-09-27 MED ORDER — ADULT MULTIVITAMIN W/MINERALS CH
1.0000 | ORAL_TABLET | Freq: Every day | ORAL | Status: DC
  Administered 2017-09-27 – 2017-09-29 (×3): 1 via ORAL
  Filled 2017-09-27 (×3): qty 1

## 2017-09-27 MED ORDER — TRAZODONE HCL 50 MG PO TABS
50.0000 mg | ORAL_TABLET | Freq: Every day | ORAL | Status: DC
  Administered 2017-09-27 – 2017-09-28 (×2): 50 mg via ORAL
  Filled 2017-09-27 (×2): qty 1

## 2017-09-27 MED ORDER — ACETAMINOPHEN 325 MG PO TABS
650.0000 mg | ORAL_TABLET | Freq: Four times a day (QID) | ORAL | Status: DC | PRN
  Administered 2017-09-27 – 2017-09-29 (×5): 650 mg via ORAL
  Filled 2017-09-27 (×5): qty 2

## 2017-09-27 NOTE — Evaluation (Addendum)
Physical Therapy Evaluation Patient Details Name: Alex Torres MRN: [id number] DOB: 1955-03-30 Today's Date: 09/27/2017   History of Present Illness  62 yo male admitted with pancreatitis, ETOH withdrawal. Went into cardiac arrest 8/5. Intubated 8/5, Extubated 8/6. Hx of ETOH abuse    Clinical Impression  Bed level eval only on today. Pt unwilling to attempt mobility/OOB activity on today with PT. He c/o being "dog tired." Explained importance of PT eval and mobilization. He did allow therapist to perform UE and LE assessment. At this time, will recommend SNF. Will update recommendations as necessary. Will continue to follow and progress activity as pt will allow.     Follow Up Recommendations SNF    Equipment Recommendations  Rolling walker with 5" wheels    Recommendations for Other Services       Precautions / Restrictions Precautions Precautions: Fall Restrictions Weight Bearing Restrictions: No      Mobility  Bed Mobility               General bed mobility comments: NT-pt unwilling to attempt on today. He c/o being "dog tired" and stated that assessment would not be accurate.   Transfers                    Ambulation/Gait                Stairs            Wheelchair Mobility    Modified Rankin (Stroke Patients Only)       Balance                                             Pertinent Vitals/Pain Pain Assessment: No/denies pain    Home Living Family/patient expects to be discharged to:: Private residence Living Arrangements: Alone   Type of Home: Mobile home Home Access: Stairs to enter Entrance Stairs-Rails: Psychiatric nurse of Steps: at least 4-5 Home Layout: One level Home Equipment: None      Prior Function Level of Independence: Independent               Hand Dominance        Extremity/Trunk Assessment   Upper Extremity Assessment Upper Extremity Assessment:  Generalized weakness (at least 3/5 throughout)    Lower Extremity Assessment Lower Extremity Assessment: Generalized weakness (at least 3/5 throughout)       Communication   Communication: No difficulties  Cognition Arousal/Alertness: Awake/alert(but drowsy) Behavior During Therapy: Flat affect Overall Cognitive Status: No family/caregiver present to determine baseline cognitive functioning                                        General Comments      Exercises     Assessment/Plan    PT Assessment Patient needs continued PT services  PT Problem List Decreased mobility;Decreased strength;Decreased activity tolerance;Decreased balance;Decreased knowledge of use of DME       PT Treatment Interventions DME instruction;Gait training;Therapeutic activities;Therapeutic exercise;Patient/family education;Balance training;Functional mobility training    PT Goals (Current goals can be found in the Care Plan section)  Acute Rehab PT Goals Patient Stated Goal: to get some sleep PT Goal Formulation: Patient unable to participate in goal setting Time For Goal Achievement: 10/11/17 Potential to Achieve  Goals: Fair    Frequency Min 3X/week   Barriers to discharge        Co-evaluation               AM-PAC PT "6 Clicks" Daily Activity  Outcome Measure Difficulty turning over in bed (including adjusting bedclothes, sheets and blankets)?: Unable Difficulty moving from lying on back to sitting on the side of the bed? : Unable Difficulty sitting down on and standing up from a chair with arms (e.g., wheelchair, bedside commode, etc,.)?: Unable Help needed moving to and from a bed to chair (including a wheelchair)?: Total Help needed walking in hospital room?: Total Help needed climbing 3-5 steps with a railing? : Total 6 Click Score: 6    End of Session   Activity Tolerance: Patient limited by fatigue Patient left: in bed;with bed alarm set;with call bell/phone  within reach   PT Visit Diagnosis: Muscle weakness (generalized) (M62.81);Difficulty in walking, not elsewhere classified (R26.2)    Time: 2336-1224 PT Time Calculation (min) (ACUTE ONLY): 8 min   Charges:   PT Evaluation $PT Eval Moderate Complexity: 1 Mod            Weston Anna, MPT Pager: [phone]

## 2017-09-27 NOTE — Progress Notes (Signed)
PROGRESS NOTE    [name]  CWU:[id number] DOB: Apr 29, 1955 DOA: 09/16/2017 PCP: Hayden Rasmussen, MD   Brief Narrative: Alex Torres is a 62 y.o. male with medical history significant for hypertension and splenectomy (1972), alcohol abuse. He presented secondary to abdominal pain and found to have acute pancreatitis. He was treated for that, alcohol withdrawal and pneumonia. He subsequently had respiratory arrest resulting in PEA arrest requiring CPR and intubation. He has been extubated and doing well. Plan for discharge to SNF.   Assessment & Plan:   Principal Problem:   Acute respiratory failure with hypoxia and hypercapnia (HCC) Active Problems:   Acute pancreatitis   Encephalopathy acute   Acute pancreatitis Alcohol induced. Resolved. Tolerating regular diet.  Acute respiratory failure with hypoxia and hypoxemia PEA arrest Patient required intubation s/p PEA arrest in setting of oversedation from narcotics on top of underlying OHA and OHS. Patient extubated on 8/6. Has been on Bipap which he does not tolerate per him. -Wean oxygen -Will need ABG prior to discharge -Outpatient pulmonology follow-up  Aspiration pneumonia Treated with Zosyn. Last dose today  Alcohol abuse/withdrawal Withdrawal managed while inpatient with Precedex and a librium taper.  Acute kidney injury Secondary to dehydration. Resolved.  Chest pain Secondary to compressions from PEA arrest.  -Lidoderm patch  Essential hypertension Uncontrolled. -Continue metoprolol and amlodipine -May need to add a third mediation  Enlarged prostate -Continue Flomax. Recommend outpatient follow-up  Iron deficiency anemia Given Feraheme during this admission.  Insomnia Secondary to anxiety in setting of hospital course. Will want to avoid benzodiazepines. -Trazodone 50 mg qhs -Would benefit from an SSRI for long term management of anxiety   DVT prophylaxis: Lovenox Code Status:   Code Status:  Full Code Family Communication: None at bedside Disposition Plan: Discharge to SNF likely in 1-2 days   Consultants:   Pulmonology/Critical care  Procedures:   8/5>>8/6: ETT  Antimicrobials:  Zosyn    Subjective: Some chest discomfort  Objective: Vitals:   09/26/17 1951 09/26/17 2037 09/27/17 0634 09/27/17 0838  BP:  (!) 144/78 (!) 156/89   Pulse:  (!) 103 85   Resp:  20 20   Temp:  98.7 F (37.1 C) 98 F (36.7 C)   TempSrc:  Oral Oral   SpO2: 99% 96% 90% 97%  Weight:      Height:        Intake/Output Summary (Last 24 hours) at 09/27/2017 1253 Last data filed at 09/27/2017 0600 Gross per 24 hour  Intake 429.37 ml  Output 650 ml  Net -220.63 ml   Filed Weights   09/16/17 1641 09/21/17 1714 09/24/17 0400  Weight: 93.8 kg 98.5 kg 98 kg    Examination:  General exam: Appears calm and comfortable Respiratory system: Diminished breath sounds. Respiratory effort normal. Cardiovascular system: S1 & S2 heard, RRR. No murmurs, rubs, gallops or clicks. Gastrointestinal system: Abdomen is nondistended, soft and nontender. Normal bowel sounds heard. Central nervous system: Alert and oriented. No focal neurological deficits. Extremities: No edema. No calf tenderness Skin: No cyanosis. No rashes Psychiatry: Judgement and insight appear normal. Mood & affect appropriate.     Data Reviewed: I have personally reviewed following labs and imaging studies  CBC: Recent Labs  Lab 09/23/17 0454 09/24/17 0405 09/25/17 0331 09/26/17 0332 09/27/17 0504  WBC 23.2* 23.0* 14.2* 10.6* 11.9*  HGB 10.5* 10.8* 9.5* 9.6* 10.3*  HCT 36.1* 38.4* 31.7* 31.1* 34.1*  MCV 77.6* 80.3 74.9* 73.2* 73.8*  PLT 384 248 428*  466* 196*   Basic Metabolic Panel: Recent Labs  Lab 09/21/17 0320 09/22/17 0524 09/23/17 0454 09/24/17 0405 09/25/17 0331 09/26/17 0332 09/27/17 0504  NA 140 139 139 140 138 139 142  K 3.6 3.5 4.7 4.7 3.0* 3.2* 3.4*  CL 98 96* 93* 94* 96* 100 105  CO2 32 30  35* 33* 30 29 29   GLUCOSE 100* 140* 151* 199* 143* 142* 126*  BUN 12 8 10 13 15 9  7*  CREATININE 0.68 0.51* 0.61 1.08 0.90 0.70 0.71  CALCIUM 8.6* 8.4* 8.7* 8.9 8.5* 8.2* 8.8*  MG 1.9 1.9 1.9 2.2 1.9  --   --   PHOS 3.1 3.5 3.9  --  1.9*  --   --    GFR: Estimated Creatinine Clearance: 109.1 mL/min (by C-G formula based on SCr of 0.71 mg/dL). Liver Function Tests: Recent Labs  Lab 09/24/17 0405 09/25/17 0331 09/26/17 0332  AST 45* 35 35  ALT 34 30 28  ALKPHOS 86 61 62  BILITOT 1.2 0.6 0.4  PROT 6.6 5.9* 6.1*  ALBUMIN 2.6* 2.1* 2.2*   Recent Labs  Lab 09/26/17 0332  LIPASE 39   No results for input(s): AMMONIA in the last 168 hours. Coagulation Profile: No results for input(s): INR, PROTIME in the last 168 hours. Cardiac Enzymes: Recent Labs  Lab 09/24/17 0536  TROPONINI 0.03*   BNP (last 3 results) No results for input(s): PROBNP in the last 8760 hours. HbA1C: No results for input(s): HGBA1C in the last 72 hours. CBG: Recent Labs  Lab 09/24/17 2005 09/25/17 0006 09/25/17 0346 09/25/17 0802 09/25/17 1231  GLUCAP 106* 139* 141* 122* 105*   Lipid Profile: No results for input(s): CHOL, HDL, LDLCALC, TRIG, CHOLHDL, LDLDIRECT in the last 72 hours. Thyroid Function Tests: No results for input(s): TSH, T4TOTAL, FREET4, T3FREE, THYROIDAB in the last 72 hours. Anemia Panel: No results for input(s): VITAMINB12, FOLATE, FERRITIN, TIBC, IRON, RETICCTPCT in the last 72 hours. Sepsis Labs: Recent Labs  Lab 09/24/17 0536 09/24/17 0808 09/25/17 0331 09/26/17 0332  PROCALCITON 0.21  --  0.14 0.20  LATICACIDVEN 2.9* 2.4*  --   --     Recent Results (from the past 240 hour(s))  Culture, blood (Routine X 2) w Reflex to ID Panel     Status: None   Collection Time: 09/18/17  4:29 PM  Result Value Ref Range Status   Specimen Description   Final    BLOOD LEFT ANTECUBITAL Performed at [facility], [place] [address] [place], [place] [address]     Special Requests   Final    BOTTLES DRAWN AEROBIC AND ANAEROBIC Blood Culture adequate volume Performed at [place] [address], [place], [place] [address]    Culture   Final    NO GROWTH 5 DAYS Performed at [facility], [place] [address] [place], [place] [address]    Report Status 09/23/2017 FINAL  Final  Culture, blood (Routine X 2) w Reflex to ID Panel     Status: None   Collection Time: 09/18/17  4:36 PM  Result Value Ref Range Status   Specimen Description   Final    BLOOD LEFT HAND Performed at [place] [address], [place], [place] [address]    Special Requests   Final    BOTTLES DRAWN AEROBIC ONLY Blood Culture results may not be optimal due to an inadequate volume of blood received in culture bottles Performed at [facility],  [place] [address], [place], [place] [address]    Culture   Final    NO GROWTH 5 DAYS Performed at [facility], [place] [address], [place], [place] [address]    Report Status 09/23/2017 FINAL  Final  MRSA PCR Screening     Status: None   Collection Time: 09/24/17  5:35 AM  Result Value Ref Range Status   MRSA by PCR NEGATIVE NEGATIVE Final    Comment:        The GeneXpert MRSA Assay (FDA approved for NASAL specimens only), is one component of a comprehensive MRSA colonization surveillance program. It is not intended to diagnose MRSA infection nor to guide or monitor treatment for MRSA infections. Performed at [facility], [place] [address], [place], [place] [address]   Culture, respiratory (non-expectorated)     Status: None   Collection Time: 09/24/17 11:36 AM  Result Value Ref Range Status   Specimen Description   Final    ENDOTRACHEAL Performed at [place] [address] [place], [place] [address]    Special Requests   Final    NONE Performed at [facility], [place] [address], [place], [place]  [address]    Gram Stain   Final    RARE WBC PRESENT,BOTH PMN AND MONONUCLEAR RARE GRAM POSITIVE RODS IN PAIRS IN CHAINS    Culture   Final    Consistent with normal respiratory flora. Performed at [facility], [place] [address], [place], [place] [address]    Report Status 09/26/2017 FINAL  Final         Radiology Studies: No results found.      Scheduled Meds: . amLODipine  10 mg Oral Daily  . enoxaparin (LOVENOX) injection  40 mg Subcutaneous Daily  . folic acid  1 mg Oral Daily  . ipratropium-albuterol  3 mL Nebulization Q6H WA  . lidocaine  1 patch Transdermal Q24H  . mouth rinse  15 mL Mouth Rinse BID  . metoprolol succinate  25 mg Oral Daily  . multivitamin with minerals  1 tablet Oral Daily  . thiamine  100 mg Oral Daily  . traZODone  50 mg Oral QHS   Continuous Infusions: . sodium chloride 10 mL/hr at 09/26/17 1813  . piperacillin-tazobactam (ZOSYN)  IV 3.375 g (09/27/17 1014)     LOS: 11 days     Cordelia Poche, MD [facility] 09/27/2017, 12:53 PM Pager: [phone]  If 7PM-7AM, please contact night-coverage [email or web address] 09/27/2017, 12:53 PM

## 2017-09-27 NOTE — Clinical Social Work Note (Signed)
Clinical Social Work Assessment  Patient Details  Name: Alex Torres MRN: [id number] Date of Birth: 12-02-1955  Date of referral:  09/27/17               Reason for consult:  Facility Placement, Discharge Planning                Permission sought to share information with:  Chartered certified accountant granted to share information::  Yes, Verbal Permission Granted  Name::        Agency::     Relationship::     Contact Information:     Housing/Transportation Living arrangements for the past 2 months:  Single Family Home Source of Information:  Patient Patient Interpreter Needed:  None Criminal Activity/Legal Involvement Pertinent to Current Situation/Hospitalization:  No - Comment as needed Significant Relationships:  Pets Lives with:  Self Do you feel safe going back to the place where you live?  (PT recommending SNF) Need for family participation in patient care:  No (Coment)  Care giving concerns:  Patient from home alone. Patient reported that prior to hospitalization he was independent with ambulation and ADLs. Patient was admitted with acute pancreatitis. PT recommending SNF.   Social Worker assessment / plan:  CSW spoke with patient at bedside regarding discharge planning and PT recommendation for SNF. CSW explained SNF placement process and inquired about patient's payor source. Patient reported that his Alex Torres insurance was recently terminated 2 weeks ago because he owed 2000.00 to cover insurance costs. Patient explained to CSW that he has been receiving ST disability since Nov. 2018 after having surgery on neck. Patient reported that he was supposed to return to work then he had to have another surgery scheduled and that his short term disability was transitioned into Alex Torres term disability. Patient reported that he is unable to private pay for SNF. CSW and patient discussed patient's alcohol use. Patient reported that he drinks daily and that he has to stop. Patient  reported that he has been drinking for a Alex Torres time and that he will have to stop. CSW validated patient's plan and motivation to stop drinking. CSW agreed to staff case with leadership to see what options were available for patient. Patient reported that he may just have to return home.  CSW staffed case with Alex Torres, quantity, Alex Torres, quantity advised that patient be seen again by PT to see patient's level functioning since patient did not ambulate with PT. CSW Torres, quantity advised CSW to complete a SNF workup.   CSW notified patient's RN to re-consult PT to see patient again.  CSW completed patient's FL2 and will follow up with bed offers. CSW initiated patient's PASRR, currently pending MUST ID [id number].  Barriers Patient does not have a payor source for ST rehab at SNF. Patient does not currently have a PASRR.   Employment status:  Disabled (Comment on whether or not currently receiving Disability)(PAtient reported that he currently receives approx 1500 month in Bennett Torres term disability) Insurance information:  Self Pay (Medicaid Pending) PT Recommendations:  Alex Torres / Referral to community resources:  Alex Torres  Patient/Family's Response to care:  Patient concerned about ability to afford SNF. Patient appreciative of CSW visit.   Patient/Family's Understanding of and Emotional Response to Diagnosis, Current Treatment, and Prognosis:  Patient presented calm and verbalized understanding of PT recommendation. Patient expressed concerns about being able to afford SNF, CSW acknowledged and validated patient's concerns and agreed to follow up with leadership  to see what options were available for patient. Patient reported that he still has to maintain his home while at Alex Torres rehab.   Emotional Assessment Appearance:  Appears stated age Attitude/Demeanor/Rapport:  Engaged Affect (typically observed):  Calm, Appropriate Orientation:   Oriented to Self, Oriented to Situation, Oriented to Place, Oriented to  Time Alcohol / Substance use:  Not Applicable Psych involvement (Current and /or in the community):  No (Comment)  Discharge Needs  Concerns to be addressed:  Care Coordination Readmission within the last 30 days:  No Current discharge risk:  Lives alone, Physical Impairment Barriers to Discharge:  Alex Torres), Continued Medical Work up, Inadequate or no [employer], LCSW 09/27/2017, 3:39 PM

## 2017-09-27 NOTE — Progress Notes (Signed)
Patient c/o BiPAP causing ear aches and his inability to tolerate the pressures. Settings currently on 16/8. He explains that he has had a hole in his ear drum x 3 on the left and x2 on the right. Settings changed to auto titration BiPAP wit hsettings Imax 16 Emin 6 minPS 4 maxPS 6.

## 2017-09-27 NOTE — NC FL2 (Signed)
[place] LEVEL OF CARE SCREENING TOOL     IDENTIFICATION  Patient Name: Alex Torres Birthdate: 14-Dec-1955 Sex: male Admission Date (Current Location): 09/16/2017  [facility] and [place] Number:  Herbalist and Address:  [facility],  [place], [place]      Provider Number: [id number]  Attending Physician Name and Address:  Mariel Aloe, MD  Relative Name and Phone Number:       Current Level of Care: Hospital Recommended Level of Care: [place] Prior Approval Number:    Date Approved/Denied:   PASRR Number:    Discharge Plan: SNF    Current Diagnoses: Patient Active Problem List   Diagnosis Date Noted  . Acute respiratory failure with hypoxia and hypercapnia ([place]) 09/19/2017  . Encephalopathy acute 09/19/2017  . Acute pancreatitis 09/16/2017  . Myelopathy ([place]) 01/17/2017    Orientation RESPIRATION BLADDER Height & Weight     Self, Time, Situation, Place  O2, Other (Comment)(Bipap Large Adult Full Face Mask 3lpm IPAP 16cmH20 EPAP 8cmH20 ) Incontinent Weight: 216 lb 0.8 oz (98 kg) Height:  5\' 9"  (175.3 cm)  BEHAVIORAL SYMPTOMS/MOOD NEUROLOGICAL BOWEL NUTRITION STATUS      Incontinent Diet(see dc summary)  AMBULATORY STATUS COMMUNICATION OF NEEDS Skin   Extensive Assist Verbally Normal                       Personal Care Assistance Level of Assistance  Bathing, Feeding, Dressing Bathing Assistance: Maximum assistance Feeding assistance: Independent Dressing Assistance: Maximum assistance     Functional Limitations Info  Sight, Hearing, Speech Sight Info: Adequate Hearing Info: Impaired Speech Info: Adequate    SPECIAL CARE FACTORS FREQUENCY  PT (By licensed PT)     PT Frequency: Min 3x/week              Contractures      Additional Factors Info  Code Status, Allergies Code Status Info: Full Code Allergies Info: Oxycodone           Current Medications  (09/27/2017):  This is the current hospital active medication list Current Facility-Administered Medications  Medication Dose Route Frequency Provider Last Rate Last Dose  . 0.9 %  sodium chloride infusion   Intravenous Continuous Erick Colace, NP 10 mL/hr at 09/26/17 1813    . acetaminophen (TYLENOL) tablet 650 mg  650 mg Oral Q6H PRN Mariel Aloe, MD      . amLODipine (NORVASC) tablet 10 mg  10 mg Oral Daily Erick Colace, NP   10 mg at 09/27/17 1013  . bisacodyl (DULCOLAX) suppository 10 mg  10 mg Rectal Daily PRN Erick Colace, NP   10 mg at 09/26/17 6734  . docusate (COLACE) 50 MG/5ML liquid 100 mg  100 mg Per Tube BID PRN Erick Colace, NP      . enoxaparin (LOVENOX) injection 40 mg  40 mg Subcutaneous Daily Erick Colace, NP   40 mg at 09/27/17 1013  . feeding supplement (BOOST / RESOURCE BREEZE) liquid 1 Container  1 Container Oral BID BM Mariel Aloe, MD   1 Container at 09/27/17 1504  . folic acid (FOLVITE) tablet 1 mg  1 mg Oral Daily Erick Colace, NP   1 mg at 09/27/17 1013  . ibuprofen (ADVIL,MOTRIN) tablet 600 mg  600 mg Oral Q6H PRN Erick Colace, NP   600 mg at 09/27/17 0437  . ipratropium-albuterol (DUONEB) 0.5-2.5 (  3) MG/3ML nebulizer solution 3 mL  3 mL Nebulization BID Cordelia Poche A, MD      . lidocaine (LIDODERM) 5 % 1 patch  1 patch Transdermal Q24H Erick Colace, NP   1 patch at 09/27/17 1018  . MEDLINE mouth rinse  15 mL Mouth Rinse BID Erick Colace, NP   15 mL at 09/27/17 1215  . metoprolol succinate (TOPROL-XL) 24 hr tablet 25 mg  25 mg Oral Daily Erick Colace, NP   25 mg at 09/27/17 1013  . multivitamin with minerals tablet 1 tablet  1 tablet Oral Daily Mariel Aloe, MD   1 tablet at 09/27/17 1208  . MUSCLE RUB CREA   Topical PRN Erick Colace, NP      . piperacillin-tazobactam (ZOSYN) IVPB 3.375 g  3.375 g Intravenous Q8H Mariel Aloe, MD 12.5 mL/hr at 09/27/17 1014 3.375 g at 09/27/17 1014  . sodium phosphate (FLEET)  7-19 GM/118ML enema 1 enema  1 enema Rectal Daily PRN Erick Colace, NP      . thiamine (VITAMIN B-1) tablet 100 mg  100 mg Oral Daily Erick Colace, NP   100 mg at 09/27/17 1013  . traZODone (DESYREL) tablet 50 mg  50 mg Oral QHS Mariel Aloe, MD         Discharge Medications: Please see discharge summary for a list of discharge medications.  Relevant Imaging Results:  Relevant Lab Results:   Additional Information SSN [id number]  Burnis Medin, LCSW
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
## 2017-09-28 MED ORDER — HYDRALAZINE HCL 20 MG/ML IJ SOLN: 5.0000 mg | Freq: Four times a day (QID) | INTRAMUSCULAR | Status: DC | PRN

## 2017-09-28 MED ORDER — LISINOPRIL 20 MG PO TABS
20.0000 mg | ORAL_TABLET | Freq: Every day | ORAL | Status: DC
  Administered 2017-09-28 – 2017-09-29 (×2): 20 mg via ORAL
  Filled 2017-09-28 (×2): qty 1

## 2017-09-28 NOTE — Evaluation (Signed)
Occupational Therapy Re- Evaluation Patient Details Name: Alex Torres MRN: [id number] DOB: 14-Aug-1955 Today's Date: 09/28/2017    History of Present Illness 62 yo male admitted with pancreatitis, ETOH withdrawal. Went into cardiac arrest 8/5. Intubated 8/5, Extubated 8/6. Hx of ETOH abuse   Clinical Impression   Pt was discharged from acute OT services due intubation. He is now extubated and re-evaluation was complete.  Continue to recommend SNF vs 24/7 assistance.  Pt needs min A to stand and up to max A for LB adls.  Goals in acute are for min guard level.  Pt needed encouragement to participate, but he was cooperative and performed all activities requested with extra time    Follow Up Recommendations  SNF;Supervision/Assistance - 24 hour;Other (comment)    Equipment Recommendations  3 in 1 bedside commode    Recommendations for Other Services       Precautions / Restrictions Precautions Precautions: Fall Restrictions Weight Bearing Restrictions: No      Mobility Bed Mobility         Supine to sit: Min assist(extra time and use of rail) Sit to supine: Min assist   General bed mobility comments: assist for legs back to bed; trunk for EOB  Transfers   Equipment used: Rolling walker (2 wheeled)   Sit to Stand: Min assist;From elevated surface         General transfer comment: sidestepped up [place]             Standing balance-Leahy Scale: Poor                             ADL either performed or assessed with clinical judgement   ADL   Eating/Feeding: Sitting;Set up   Grooming: Minimal assistance;Sitting;Oral care   Upper Body Bathing: Minimal assistance   Lower Body Bathing: Moderate assistance   Upper Body Dressing : Minimal assistance   Lower Body Dressing: Maximal assistance                 General ADL Comments: pt sat eob for assessment with encouragement and stood and sidestepped up [place]  Vision/History: Wears glasses       Perception     Praxis      Pertinent Vitals/Pain Pain Assessment: Faces Faces Pain Scale: Hurts even more Pain Location: ribs Pain Descriptors / Indicators: Aching;Sore Pain Intervention(s): Limited activity within patient's tolerance;Premedicated before session;Monitored during session;Repositioned     Hand Dominance     Extremity/Trunk Assessment Upper Extremity Assessment Upper Extremity Assessment: Generalized weakness           Communication Communication Communication: No difficulties   Cognition Arousal/Alertness: Awake/alert Behavior During Therapy: Flat affect Overall Cognitive Status: Within Functional Limits for tasks assessed                                     General Comments       Exercises     Shoulder Instructions      Home Living Family/patient expects to be discharged to:: Unsure                                 Additional Comments: lives alone; has tub shower and standard commode      Prior Functioning/Environment  Level of Independence: Independent                 OT Problem List: Decreased strength;Impaired balance (sitting and/or standing);Decreased range of motion;Decreased activity tolerance;Impaired UE functional use      OT Treatment/Interventions: Self-care/ADL training;DME and/or AE instruction;Therapeutic activities;Therapeutic exercise    OT Goals(Current goals can be found in the care plan section) Acute Rehab OT Goals Patient Stated Goal: to get some sleep OT Goal Formulation: With patient Time For Goal Achievement: 10/12/17 Potential to Achieve Goals: Good ADL Goals Pt Will Transfer to Toilet: (P) with min guard assist;bedside commode;ambulating Additional ADL Goal #1: (P) pt will perform adl with min guard and AE as needed  OT Frequency: Min 2X/week   Barriers to D/C:            Co-evaluation              AM-PAC PT "6 Clicks" Daily  Activity     Outcome Measure Help from another person eating meals?: A Little Help from another person taking care of personal grooming?: A Little Help from another person toileting, which includes using toliet, bedpan, or urinal?: A Lot Help from another person bathing (including washing, rinsing, drying)?: A Lot Help from another person to put on and taking off regular upper body clothing?: A Little Help from another person to put on and taking off regular lower body clothing?: A Lot 6 Click Score: 15   End of Session    Activity Tolerance: Patient tolerated treatment well Patient left: in bed;with call bell/phone within reach;with bed alarm set  OT Visit Diagnosis: Muscle weakness (generalized) (M62.81);Other abnormalities of gait and mobility (R26.89)                Time: 7846-9629 OT Time Calculation (min): 22 min Charges:  OT General Charges $OT Visit: 1 Visit OT Evaluation $OT Re-eval: 1 Re-eval  [name], OTR/L [phone] 09/28/2017  [name] 09/28/2017, 11:39 AM

## 2017-09-28 NOTE — Progress Notes (Signed)
Physical Therapy Treatment Patient Details Name: Alex Torres MRN: [id number] DOB: Feb 01, 1956 Today's Date: 09/28/2017    History of Present Illness 62 yo male admitted with pancreatitis, ETOH withdrawal. Went into cardiac arrest 8/5. Intubated 8/5, Extubated 8/6. Hx of ETOH abuse    PT Comments    Pt required increased time and repeat functional VC's to complete task.  AxO x3 but slow and groggy with delayed verbal and motor response.  Assisted OOB to amb General Gait Details: Tolerated a good distance however heavy lean on walker for stability as pt demonstartes a VERY unsteady gait with delayed corrective reaction and poor forward flex posture.  Attempted gait without walker as pt did not require prior to admit, pt was too unsteady and present with total LOB therapist recovered.    Prior pt lived Indep and will need ST Rehab to regain safe mobility for home.  Follow Up Recommendations  SNF     Equipment Recommendations  Rolling walker with 5" wheels    Recommendations for Other Services       Precautions / Restrictions Precautions Precautions: Fall Restrictions Weight Bearing Restrictions: No    Mobility  Bed Mobility Overal bed mobility: Needs Assistance Bed Mobility: Supine to Sit     Supine to sit: Min assist Sit to supine: Min assist   General bed mobility comments: assist for upper due to chest/rib discomfort and increased time to complete task.  Mild c/o dizziness with position change but decreased with time allowed EOB.    Transfers Overall transfer level: Needs assistance Equipment used: Rolling walker (2 wheeled) Transfers: Sit to/from [facility] Sit to Stand: Min assist;From elevated surface Stand pivot transfers: Mod assist       General transfer comment: 50% VC's on proper tech and safety with turns.  Slow to react and unsteady with turns.    Ambulation/Gait Ambulation/Gait assistance: Min assist;Min guard Gait Distance (Feet): 120  Feet Assistive device: Rolling walker (2 wheeled) Gait Pattern/deviations: Step-through pattern;Decreased stride length;Trunk flexed     General Gait Details: Tolerated a good distance however heavy lean on walker for stability as pt demonstartes a VERY unsteady gait with delayed corrective reaction and poor forward flex posture.  Attempted gait without walker as pt did not require prior to admit, pt was too unsteady and present with total LOB therapist recovered.      Stairs             Wheelchair Mobility    Modified Rankin (Stroke Patients Only)       Balance             Standing balance-Leahy Scale: Poor                              Cognition Arousal/Alertness: Awake/alert Behavior During Therapy: Flat affect Overall Cognitive Status: Within Functional Limits for tasks assessed                                 General Comments: slow and groggy.  Required repeat direction and slow to respond.  A x O x 3 but delayed verbally and physically      Exercises      General Comments        Pertinent Vitals/Pain Pain Assessment: Faces Faces Pain Scale: Hurts little more Pain Location: chest/ribs from recent CPR compressions Pain Descriptors / Indicators: Aching;Sore;Grimacing Pain  Intervention(s): Monitored during session    Home Living Family/patient expects to be discharged to:: Unsure               Additional Comments: lives alone; has tub shower and standard commode    Prior Function Level of Independence: Independent          PT Goals (current goals can now be found in the care plan section) Acute Rehab PT Goals Patient Stated Goal: to get some sleep Progress towards PT goals: Progressing toward goals    Frequency    Min 3X/week      PT Plan Current plan remains appropriate    Co-evaluation              AM-PAC PT "6 Clicks" Daily Activity  Outcome Measure  Difficulty turning over in bed (including  adjusting bedclothes, sheets and blankets)?: A Lot Difficulty moving from lying on back to sitting on the side of the bed? : A Lot Difficulty sitting down on and standing up from a chair with arms (e.g., wheelchair, bedside commode, etc,.)?: A Lot Help needed moving to and from a bed to chair (including a wheelchair)?: A Lot Help needed walking in hospital room?: A Lot Help needed climbing 3-5 steps with a railing? : Total 6 Click Score: 11    End of Session Equipment Utilized During Treatment: Gait belt Activity Tolerance: Patient limited by fatigue;Patient limited by pain Patient left: in chair;with bed alarm set   PT Visit Diagnosis: Muscle weakness (generalized) (M62.81);Difficulty in walking, not elsewhere classified (R26.2)     Time: 6606-3016 PT Time Calculation (min) (ACUTE ONLY): 27 min  Charges:  $Gait Training: 8-22 mins $Therapeutic Activity: 8-22 mins                     Rica Koyanagi  PTA [facility]  Acute  Rehab Pager      [phone]

## 2017-09-28 NOTE — Progress Notes (Signed)
PROGRESS NOTE    Alex Torres  MEQ:[id number] DOB: 10-10-1955 DOA: 09/16/2017 PCP: Hayden Rasmussen, MD   Brief Narrative: Alex Torres is a 62 y.o. male with medical history significant for hypertension and splenectomy (1972), alcohol abuse. He presented secondary to abdominal pain and found to have acute pancreatitis. He was treated for that, alcohol withdrawal and pneumonia. He subsequently had respiratory arrest resulting in PEA arrest requiring CPR and intubation. He has been extubated and doing well. Plan for discharge to SNF.   Assessment & Plan:   Principal Problem:   Acute respiratory failure with hypoxia and hypercapnia (HCC) Active Problems:   Acute pancreatitis   Encephalopathy acute   Acute pancreatitis Alcohol induced. Resolved. Tolerating regular diet.  Acute respiratory failure with hypoxia and hypoxemia PEA arrest Patient required intubation s/p PEA arrest in setting of oversedation from narcotics on top of underlying OHA and OHS. Patient extubated on 8/6. Has been on Bipap which he does not tolerate per him. -Wean oxygen -Will need ABG prior to discharge -Outpatient pulmonology follow-up  Aspiration pneumonia Treated with Zosyn. Completed course.  Alcohol abuse/withdrawal Withdrawal managed while inpatient with Precedex and a librium taper.  Acute kidney injury Secondary to dehydration. Resolved.  Chest pain Secondary to compressions from PEA arrest.  -Lidoderm patch  Essential hypertension Uncontrolled. -Continue metoprolol and amlodipine -Hydralazine prn -May need to add a third mediation  Enlarged prostate -Continue Flomax. Recommend outpatient follow-up  Iron deficiency anemia Given Feraheme during this admission.  Insomnia Secondary to anxiety in setting of hospital course. Will want to avoid benzodiazepines. -Trazodone 50 mg qhs -Would benefit from an SSRI for long term management of anxiety   DVT prophylaxis: Lovenox Code  Status:   Code Status: Full Code Family Communication: None at bedside Disposition Plan: Discharge to SNF when bed available. Medically stable for discharge. Will obtain ABG prior to discharge.   Consultants:   Pulmonology/Critical care  Procedures:   8/5>>8/6: ETT  Antimicrobials:  Zosyn    Subjective: Some chest discomfort  Objective: Vitals:   09/27/17 1959 09/27/17 2009 09/28/17 0522 09/28/17 0719  BP:  (!) 169/94 (!) 179/94   Pulse:  98 98   Resp:  20 20   Temp:  98.4 F (36.9 C) 98.1 F (36.7 C)   TempSrc:  Oral Oral   SpO2: 94% 93% 90% (!) 88%  Weight:      Height:        Intake/Output Summary (Last 24 hours) at 09/28/2017 1255 Last data filed at 09/27/2017 2057 Gross per 24 hour  Intake 48 ml  Output 100 ml  Net -52 ml   Filed Weights   09/16/17 1641 09/21/17 1714 09/24/17 0400  Weight: 93.8 kg 98.5 kg 98 kg    Examination:  General exam: Appears calm and comfortable Respiratory system: Clear to auscultation. Respiratory effort normal. Cardiovascular system: S1 & S2 heard, RRR. No murmurs, rubs, gallops or clicks. Gastrointestinal system: Abdomen is nondistended, soft and nontender. Normal bowel sounds heard. Central nervous system: Alert and oriented. No focal neurological deficits. Extremities: No edema. No calf tenderness Skin: No cyanosis. No rashes Psychiatry: Judgement and insight appear normal. Flat affect    Data Reviewed: I have personally reviewed following labs and imaging studies  CBC: Recent Labs  Lab 09/23/17 0454 09/24/17 0405 09/25/17 0331 09/26/17 0332 09/27/17 0504  WBC 23.2* 23.0* 14.2* 10.6* 11.9*  HGB 10.5* 10.8* 9.5* 9.6* 10.3*  HCT 36.1* 38.4* 31.7* 31.1* 34.1*  MCV 77.6* 80.3 74.9*  73.2* 73.8*  PLT 384 248 428* 466* 454*   Basic Metabolic Panel: Recent Labs  Lab 09/22/17 0524 09/23/17 0454 09/24/17 0405 09/25/17 0331 09/26/17 0332 09/27/17 0504  NA 139 139 140 138 139 142  K 3.5 4.7 4.7 3.0* 3.2* 3.4*    CL 96* 93* 94* 96* 100 105  CO2 30 35* 33* 30 29 29   GLUCOSE 140* 151* 199* 143* 142* 126*  BUN 8 10 13 15 9  7*  CREATININE 0.51* 0.61 1.08 0.90 0.70 0.71  CALCIUM 8.4* 8.7* 8.9 8.5* 8.2* 8.8*  MG 1.9 1.9 2.2 1.9  --   --   PHOS 3.5 3.9  --  1.9*  --   --    GFR: Estimated Creatinine Clearance: 109.1 mL/min (by C-G formula based on SCr of 0.71 mg/dL). Liver Function Tests: Recent Labs  Lab 09/24/17 0405 09/25/17 0331 09/26/17 0332  AST 45* 35 35  ALT 34 30 28  ALKPHOS 86 61 62  BILITOT 1.2 0.6 0.4  PROT 6.6 5.9* 6.1*  ALBUMIN 2.6* 2.1* 2.2*   Recent Labs  Lab 09/26/17 0332  LIPASE 39   No results for input(s): AMMONIA in the last 168 hours. Coagulation Profile: No results for input(s): INR, PROTIME in the last 168 hours. Cardiac Enzymes: Recent Labs  Lab 09/24/17 0536  TROPONINI 0.03*   BNP (last 3 results) No results for input(s): PROBNP in the last 8760 hours. HbA1C: No results for input(s): HGBA1C in the last 72 hours. CBG: Recent Labs  Lab 09/24/17 2005 09/25/17 0006 09/25/17 0346 09/25/17 0802 09/25/17 1231  GLUCAP 106* 139* 141* 122* 105*   Lipid Profile: No results for input(s): CHOL, HDL, LDLCALC, TRIG, CHOLHDL, LDLDIRECT in the last 72 hours. Thyroid Function Tests: No results for input(s): TSH, T4TOTAL, FREET4, T3FREE, THYROIDAB in the last 72 hours. Anemia Panel: No results for input(s): VITAMINB12, FOLATE, FERRITIN, TIBC, IRON, RETICCTPCT in the last 72 hours. Sepsis Labs: Recent Labs  Lab 09/24/17 0536 09/24/17 0808 09/25/17 0331 09/26/17 0332  PROCALCITON 0.21  --  0.14 0.20  LATICACIDVEN 2.9* 2.4*  --   --     Recent Results (from the past 240 hour(s))  Culture, blood (Routine X 2) w Reflex to ID Panel     Status: None   Collection Time: 09/18/17  4:29 PM  Result Value Ref Range Status   Specimen Description   Final    BLOOD LEFT ANTECUBITAL Performed at [facility], [place] [address], [place], [place]  [address]    Special Requests   Final    BOTTLES DRAWN AEROBIC AND ANAEROBIC Blood Culture adequate volume Performed at [facility] [address] [place], [place] [address]    Culture   Final    NO GROWTH 5 DAYS Performed at [facility], [place] [address] [place], [place] [address]    Report Status 09/23/2017 FINAL  Final  Culture, blood (Routine X 2) w Reflex to ID Panel     Status: None   Collection Time: 09/18/17  4:36 PM  Result Value Ref Range Status   Specimen Description   Final    BLOOD LEFT HAND Performed at [facility] [address] [place], [place] [address]    Special Requests   Final    BOTTLES DRAWN AEROBIC ONLY Blood Culture results may not be optimal due to an inadequate volume of blood received in culture bottles Performed at [facility] [place]  [place], [place] [address]    Culture   Final    NO GROWTH 5 DAYS Performed at [facility], [place] [address], [place], [place] [address]    Report Status 09/23/2017 FINAL  Final  MRSA PCR Screening     Status: None   Collection Time: 09/24/17  5:35 AM  Result Value Ref Range Status   MRSA by PCR NEGATIVE NEGATIVE Final    Comment:        The GeneXpert MRSA Assay (FDA approved for NASAL specimens only), is one component of a comprehensive MRSA colonization surveillance program. It is not intended to diagnose MRSA infection nor to guide or monitor treatment for MRSA infections. Performed at [facility], [place] [address], [place], [place] [address]   Culture, respiratory (non-expectorated)     Status: None   Collection Time: 09/24/17 11:36 AM  Result Value Ref Range Status   Specimen Description   Final    ENDOTRACHEAL Performed at [facility] [address] [place], [place] [address]    Special Requests   Final    NONE Performed at [facility], [place] [address]  [place], [place] [address]    Gram Stain   Final    RARE WBC PRESENT,BOTH PMN AND MONONUCLEAR RARE GRAM POSITIVE RODS IN PAIRS IN CHAINS    Culture   Final    Consistent with normal respiratory flora. Performed at [facility], [place] [address] [place], [place] [address]    Report Status 09/26/2017 FINAL  Final         Radiology Studies: No results found.      Scheduled Meds: . amLODipine  10 mg Oral Daily  . enoxaparin (LOVENOX) injection  40 mg Subcutaneous Daily  . feeding supplement  1 Container Oral BID BM  . folic acid  1 mg Oral Daily  . ipratropium-albuterol  3 mL Nebulization BID  . lidocaine  1 patch Transdermal Q24H  . lisinopril  20 mg Oral Daily  . mouth rinse  15 mL Mouth Rinse BID  . metoprolol succinate  25 mg Oral Daily  . multivitamin with minerals  1 tablet Oral Daily  . thiamine  100 mg Oral Daily  . traZODone  50 mg Oral QHS   Continuous Infusions: . sodium chloride Stopped (09/28/17 1027)     LOS: 12 days     Cordelia Poche, MD [facility] 09/28/2017, 12:55 PM Pager: [phone]  If 7PM-7AM, please contact night-coverage [email or web address] 09/28/2017, 12:55 PM

## 2017-09-28 NOTE — Progress Notes (Signed)
CSW following to assist with discharge planning. Patient was re-evaluated by PT and recommendation is for SNF. CSW staffed case with [employer] Surveyor, quantity, who approved 2 week letter of guarantee for ST rehab at [facility].  Patient received 2 bed offers, CSW provided bed offers to patient. Patient selected [facility].   Patient to discharge to [facility] under 2 week letter of guarantee.   CSW will continue to follow and assist with discharge planning.  Alex Torres, [place] Social Worker [facility] Cell#: [phone]

## 2017-09-28 NOTE — Progress Notes (Signed)
Pt. Refuses to wear CPAP/Bipap despite encouragement.  Pt. States it causes him to cough and he cant sleep.

## 2017-09-29 LAB — BLOOD GAS, ARTERIAL
Acid-Base Excess: 4.8 mmol/L — ABNORMAL HIGH (ref 0.0–2.0)
Bicarbonate: 29 mmol/L — ABNORMAL HIGH (ref 20.0–28.0)
Drawn by: 441261
FIO2: 21
O2 Saturation: 90.3 %
PATIENT TEMPERATURE: 98.6
PO2 ART: 60.3 mmHg — AB (ref 83.0–108.0)
pCO2 arterial: 43.4 mmHg (ref 32.0–48.0)
pH, Arterial: 7.441 (ref 7.350–7.450)

## 2017-09-29 MED ORDER — LIDOCAINE 5 % EX PTCH: 1.0000 | MEDICATED_PATCH | CUTANEOUS | 0 refills | Status: DC

## 2017-09-29 MED ORDER — TRAZODONE HCL 50 MG PO TABS: 50.0000 mg | ORAL_TABLET | Freq: Every day | ORAL | Status: DC

## 2017-09-29 MED ORDER — ADULT MULTIVITAMIN W/MINERALS CH: 1.0000 | ORAL_TABLET | Freq: Every day | ORAL | Status: DC

## 2017-09-29 MED ORDER — IBUPROFEN 600 MG PO TABS: 600.0000 mg | ORAL_TABLET | Freq: Four times a day (QID) | ORAL | 0 refills | Status: DC | PRN

## 2017-09-29 MED ORDER — AMLODIPINE BESYLATE 10 MG PO TABS: 10.0000 mg | ORAL_TABLET | Freq: Every day | ORAL | Status: DC

## 2017-09-29 MED ORDER — THIAMINE HCL 100 MG PO TABS: 100.0000 mg | ORAL_TABLET | Freq: Every day | ORAL | Status: DC

## 2017-09-29 MED ORDER — BOOST BREEZE PO LIQD: 237.0000 mL | Freq: Two times a day (BID) | ORAL | 0 refills | Status: DC

## 2017-09-29 MED ORDER — FOLIC ACID 1 MG PO TABS: 1.0000 mg | ORAL_TABLET | Freq: Every day | ORAL | Status: DC

## 2017-09-29 MED ORDER — IPRATROPIUM-ALBUTEROL 0.5-2.5 (3) MG/3ML IN SOLN: 3.0000 mL | RESPIRATORY_TRACT | Status: DC | PRN

## 2017-09-29 MED ORDER — ACETAMINOPHEN 325 MG PO TABS: 650.0000 mg | ORAL_TABLET | Freq: Four times a day (QID) | ORAL | Status: DC | PRN

## 2017-09-29 MED ORDER — METOPROLOL SUCCINATE ER 25 MG PO TB24: 25.0000 mg | ORAL_TABLET | Freq: Every day | ORAL | Status: DC

## 2017-09-29 NOTE — Clinical Social Work Placement (Signed)
   CLINICAL SOCIAL WORK PLACEMENT  NOTE  Date:  09/29/2017  Patient Details  Name: Alex Torres MRN: [id number] Date of Birth: [birth year]  Clinical Social Work is seeking post-discharge placement for this patient at the [place] level of care (*CSW will initial, date and re-position this form in  chart as items are completed):  Yes   Patient/family provided with [place] Work Department's list of facilities offering this level of care within the geographic area requested by the patient (or if unable, by the patient's family).  Yes   Patient/family informed of their freedom to choose among providers that offer the needed level of care, that participate in Medicare, Medicaid or managed care program needed by the patient, have an available bed and are willing to accept the patient.  Yes   Patient/family informed of [facility]'s ownership interest in [facility] and [facility], as well as of the fact that they are under no obligation to receive care at these facilities.  PASRR submitted to EDS on 09/27/17     PASRR number received on 09/28/17     Existing PASRR number confirmed on       FL2 transmitted to all facilities in geographic area requested by pt/family on 09/27/17     FL2 transmitted to all facilities within larger geographic area on       Patient informed that his/her managed care company has contracts with or will negotiate with certain facilities, including the following:        Yes   Patient/family informed of bed offers received.  Patient chooses bed at [facility])     Physician recommends and patient chooses bed at [facility])    Patient to be transferred to [facility]) on 09/29/17.  Patient to be transferred to facility by [facility]     Patient family notified on 09/29/17 of transfer.  Name of family member notified:  patient declined     PHYSICIAN       Additional Comment:     _______________________________________________ Darden Dates, LCSW 09/29/2017, 4:02 PM

## 2017-09-29 NOTE — Progress Notes (Signed)
Patient is ready for discharge today and will go to [place] with a 14 day LOG with PT. Discharge information has been sent to facility and they are ready to accept pt. RN will call report. CSW will arrange [facility] transportation. Patient is aware and agreeable to discharge plan. Clinical Social Worker answered patietn's questions. CSW also offered to call his support system to let them know he was leaving, which he declined. Clinical Social Worker signing off as no further needs identified.   REPORT INFORMATION [facility] via [facility] Room# 113-A Report #  [phone]

## 2017-09-29 NOTE — Discharge Summary (Addendum)
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 consistent   with moderate pulmonary hypertension.   Subjective: Uncomfortable in bed. Otherwise, no other complaints  Discharge Exam: Vitals:   09/29/17 1058 09/29/17 1235  BP: (!) 149/88   Pulse: (!) 103   Resp:    Temp:    SpO2:  93%   Vitals:   09/29/17 0514 09/29/17 1057 09/29/17 1058 09/29/17 1235  BP: (!) 155/88 (!) 159/88 (!) 149/88   Pulse: 96  (!) 103   Resp: 16     Temp: 97.6 F (36.4 C)     TempSrc: Oral     SpO2: 91%   93%  Weight:      Height:        General: Pt is alert, awake, not in acute distress Cardiovascular: RRR, S1/S2 +, no rubs, no gallops Respiratory: CTA bilaterally, no wheezing, no rhonchi Abdominal: Soft, NT, ND, bowel sounds + Extremities: no edema, no cyanosis    The results of significant diagnostics from this hospitalization (including imaging, microbiology, ancillary and laboratory) are listed below for reference.     Microbiology: Recent Results (from the past 240 hour(s))  MRSA PCR Screening     Status: None   Collection Time: 09/24/17  5:35 AM  Result Value Ref Range Status   MRSA by PCR NEGATIVE NEGATIVE Final     Comment:        The GeneXpert MRSA Assay (FDA approved for NASAL specimens only), is one component of a comprehensive MRSA colonization surveillance program. It is not intended to diagnose MRSA infection nor to guide or monitor treatment for MRSA infections. Performed at [facility], [place] [address], [place], [place] [address]   Culture, respiratory (non-expectorated)     Status: None   Collection Time: 09/24/17 11:36 AM  Result Value Ref Range Status   Specimen Description   Final    ENDOTRACHEAL Performed at [facility] [address], [place], [place] [address]    Special Requests   Final    NONE Performed at [facility], [place] [address] [place], [place] [address]    Gram Stain   Final    RARE WBC PRESENT,BOTH PMN AND MONONUCLEAR RARE GRAM POSITIVE RODS IN PAIRS IN CHAINS    Culture   Final    Consistent with normal respiratory flora. Performed at [facility], [place] [address], [place],  [address]    Report Status 09/26/2017 FINAL  Final     Labs: BNP (last 3 results) No results for input(s): BNP in the last 8760 hours. Basic Metabolic Panel: Recent Labs  Lab 09/23/17 0454 09/24/17 0405 09/25/17 0331 09/26/17 0332 09/27/17 0504  NA 139 140 138 139 142  K 4.7 4.7 3.0* 3.2* 3.4*  CL 93* 94* 96* 100 105  CO2 35* 33* 30 29 29   GLUCOSE 151* 199* 143* 142* 126*  BUN 10 13 15 9  7*  CREATININE 0.61 1.08 0.90 0.70 0.71  CALCIUM 8.7* 8.9 8.5* 8.2* 8.8*  MG 1.9 2.2 1.9  --   --   PHOS 3.9  --  1.9*  --   --    Liver Function Tests: Recent Labs  Lab 09/24/17 0405 09/25/17 0331 09/26/17 0332  AST 45* 35 35  ALT 34 30 28  ALKPHOS 86 61 62  BILITOT 1.2 0.6 0.4  PROT 6.6 5.9* 6.1*  ALBUMIN 2.6* 2.1* 2.2*   Recent Labs  Lab 09/26/17 0332  LIPASE 39   No results for input(s):  AMMONIA in the last 168 hours. CBC: Recent Labs  Lab 09/23/17 0454 09/24/17 0405 09/25/17 0331  09/26/17 0332 09/27/17 0504  WBC 23.2* 23.0* 14.2* 10.6* 11.9*  HGB 10.5* 10.8* 9.5* 9.6* 10.3*  HCT 36.1* 38.4* 31.7* 31.1* 34.1*  MCV 77.6* 80.3 74.9* 73.2* 73.8*  PLT 384 248 428* 466* 507*   Cardiac Enzymes: Recent Labs  Lab 09/24/17 0536  TROPONINI 0.03*   BNP: Invalid input(s): POCBNP CBG: Recent Labs  Lab 09/24/17 2005 09/25/17 0006 09/25/17 0346 09/25/17 0802 09/25/17 1231  GLUCAP 106* 139* 141* 122* 105*   D-Dimer No results for input(s): DDIMER in the last 72 hours. Hgb A1c No results for input(s): HGBA1C in the last 72 hours. Lipid Profile No results for input(s): CHOL, HDL, LDLCALC, TRIG, CHOLHDL, LDLDIRECT in the last 72 hours. Thyroid function studies No results for input(s): TSH, T4TOTAL, T3FREE, THYROIDAB in the last 72 hours.  Invalid input(s): FREET3 Anemia work up No results for input(s): VITAMINB12, FOLATE, FERRITIN, TIBC, IRON, RETICCTPCT in the last 72 hours. Urinalysis    Component Value Date/Time   COLORURINE YELLOW 09/16/2017 1234   APPEARANCEUR CLOUDY (A) 09/16/2017 1234   LABSPEC 1.020 09/16/2017 1234   PHURINE 6.0 09/16/2017 1234   GLUCOSEU NEGATIVE 09/16/2017 1234   HGBUR TRACE (A) 09/16/2017 1234   BILIRUBINUR SMALL (A) 09/16/2017 1234   KETONESUR 15 (A) 09/16/2017 1234   PROTEINUR 30 (A) 09/16/2017 1234   NITRITE NEGATIVE 09/16/2017 1234   LEUKOCYTESUR NEGATIVE 09/16/2017 1234   Sepsis Labs Invalid input(s): PROCALCITONIN,  WBC,  LACTICIDVEN Microbiology Recent Results (from the past 240 hour(s))  MRSA PCR Screening     Status: None   Collection Time: 09/24/17  5:35 AM  Result Value Ref Range Status   MRSA by PCR NEGATIVE NEGATIVE Final    Comment:        The GeneXpert MRSA Assay (FDA approved for NASAL specimens only), is one component of a comprehensive MRSA colonization surveillance program. It is not intended to diagnose MRSA infection nor to guide or monitor treatment for MRSA infections. Performed at [facility], [place] [address] [place], [place] [address]   Culture, respiratory (non-expectorated)     Status: None   Collection Time: 09/24/17 11:36 AM  Result Value Ref Range Status   Specimen Description   Final    ENDOTRACHEAL Performed at [facility] [address], [place], [place] [address]    Special Requests   Final    NONE Performed at [facility], [place] [address] [place], [place] [address]    Gram Stain   Final    RARE WBC PRESENT,BOTH PMN AND MONONUCLEAR RARE GRAM POSITIVE RODS IN PAIRS IN CHAINS    Culture   Final    Consistent with normal respiratory flora. Performed at [facility], [place] [address] [place], [place] [address]    Report Status 09/26/2017 FINAL  Final    SIGNED:   Cordelia Poche, MD [facility] 09/29/2017, 1:13 PM

## 2017-09-29 NOTE — Clinical Social Work Placement (Deleted)
   CLINICAL SOCIAL WORK PLACEMENT  NOTE  Date:  09/29/2017  Patient Details  Name: [name] MRN: [id number] Date of Birth: Aug 19, 1955  Clinical Social Work is seeking post-discharge placement for this patient at the   level of care (*CSW will initial, date and re-position this form in  chart as items are completed):      Patient/family provided with [facility] Work Department's list of facilities offering this level of care within the geographic area requested by the patient (or if unable, by the patient's family).      Patient/family informed of their freedom to choose among providers that offer the needed level of care, that participate in Medicare, Medicaid or managed care program needed by the patient, have an available bed and are willing to accept the patient.      Patient/family informed of [facility]'s ownership interest in [facility] and [facility], as well as of the fact that they are under no obligation to receive care at these facilities.  PASRR submitted to EDS on       PASRR number received on       Existing PASRR number confirmed on       FL2 transmitted to all facilities in geographic area requested by pt/family on       FL2 transmitted to all facilities within larger geographic area on       Patient informed that his/her managed care company has contracts with or will negotiate with certain facilities, including the following:            Patient/family informed of bed offers received.  Patient chooses bed at       Physician recommends and patient chooses bed at      Patient to be transferred to   on  .  Patient to be transferred to facility by       Patient family notified on   of transfer.  Name of family member notified:        PHYSICIAN       Additional Comment:    _______________________________________________ Darden Dates, LCSW 09/29/2017, 3:58 PM

## 2017-10-01 ENCOUNTER — Inpatient Hospital Stay (HOSPITAL_COMMUNITY)
Admission: RE | Admit: 2017-10-01 | Payer: Commercial Managed Care - PPO | Source: Ambulatory Visit | Admitting: Neurosurgery

## 2017-10-01 ENCOUNTER — Encounter (HOSPITAL_COMMUNITY): Admission: RE | Payer: Self-pay | Source: Ambulatory Visit

## 2017-10-01 SURGERY — POSTERIOR CERVICAL FUSION/FORAMINOTOMY LEVEL 2
Anesthesia: General

## 2017-10-02 ENCOUNTER — Non-Acute Institutional Stay (SKILLED_NURSING_FACILITY): Payer: Self-pay | Admitting: Adult Health

## 2017-10-02 ENCOUNTER — Encounter: Payer: Self-pay | Admitting: Adult Health

## 2017-10-02 DIAGNOSIS — F1020 Alcohol dependence, uncomplicated: Secondary | ICD-10-CM

## 2017-10-02 DIAGNOSIS — J690 Pneumonitis due to inhalation of food and vomit: Secondary | ICD-10-CM

## 2017-10-02 DIAGNOSIS — K852 Alcohol induced acute pancreatitis without necrosis or infection: Secondary | ICD-10-CM

## 2017-10-02 DIAGNOSIS — I10 Essential (primary) hypertension: Secondary | ICD-10-CM

## 2017-10-02 DIAGNOSIS — J9602 Acute respiratory failure with hypercapnia: Secondary | ICD-10-CM

## 2017-10-02 DIAGNOSIS — R0789 Other chest pain: Secondary | ICD-10-CM

## 2017-10-02 DIAGNOSIS — J9601 Acute respiratory failure with hypoxia: Secondary | ICD-10-CM
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
## 2017-10-04 ENCOUNTER — Non-Acute Institutional Stay (SKILLED_NURSING_FACILITY): Payer: Self-pay | Admitting: Internal Medicine

## 2017-10-04 ENCOUNTER — Encounter: Payer: Self-pay | Admitting: Internal Medicine

## 2017-10-04 DIAGNOSIS — D509 Iron deficiency anemia, unspecified: Secondary | ICD-10-CM

## 2017-10-04 DIAGNOSIS — Z9289 Personal history of other medical treatment: Secondary | ICD-10-CM

## 2017-10-04 DIAGNOSIS — F1010 Alcohol abuse, uncomplicated: Secondary | ICD-10-CM

## 2017-10-04 DIAGNOSIS — E662 Morbid (severe) obesity with alveolar hypoventilation: Secondary | ICD-10-CM

## 2017-10-04 DIAGNOSIS — E878 Other disorders of electrolyte and fluid balance, not elsewhere classified: Secondary | ICD-10-CM

## 2017-10-04 DIAGNOSIS — R0902 Hypoxemia: Secondary | ICD-10-CM

## 2017-10-04 DIAGNOSIS — G4700 Insomnia, unspecified: Secondary | ICD-10-CM

## 2017-10-04 DIAGNOSIS — R0989 Other specified symptoms and signs involving the circulatory and respiratory systems: Secondary | ICD-10-CM
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
## 2017-10-05 LAB — BASIC METABOLIC PANEL
BUN: 8 (ref 4–21)
Creatinine: 0.6 (ref 0.6–1.3)
GLUCOSE: 129
POTASSIUM: 4.7 (ref 3.4–5.3)
SODIUM: 138 (ref 137–147)

## 2017-10-05 LAB — CBC AND DIFFERENTIAL
HEMATOCRIT: 37 — AB (ref 41–53)
HEMOGLOBIN: 11.5 — AB (ref 13.5–17.5)
NEUTROS ABS: 2
Platelets: 446 — AB (ref 150–399)
WBC: 6.8

## 2017-10-11 ENCOUNTER — Non-Acute Institutional Stay (SKILLED_NURSING_FACILITY): Payer: Self-pay | Admitting: Internal Medicine

## 2017-10-11 ENCOUNTER — Encounter: Payer: Self-pay | Admitting: Internal Medicine

## 2017-10-11 DIAGNOSIS — J690 Pneumonitis due to inhalation of food and vomit: Secondary | ICD-10-CM | POA: Insufficient documentation

## 2017-10-11 DIAGNOSIS — F1020 Alcohol dependence, uncomplicated: Secondary | ICD-10-CM | POA: Insufficient documentation

## 2017-10-11 DIAGNOSIS — I10 Essential (primary) hypertension: Secondary | ICD-10-CM | POA: Insufficient documentation

## 2017-10-11 DIAGNOSIS — R0989 Other specified symptoms and signs involving the circulatory and respiratory systems: Secondary | ICD-10-CM

## 2017-10-11 DIAGNOSIS — F1010 Alcohol abuse, uncomplicated: Secondary | ICD-10-CM

## 2017-10-11 DIAGNOSIS — Z9289 Personal history of other medical treatment: Secondary | ICD-10-CM

## 2017-10-11 DIAGNOSIS — D509 Iron deficiency anemia, unspecified: Secondary | ICD-10-CM

## 2017-10-11 DIAGNOSIS — G4700 Insomnia, unspecified: Secondary | ICD-10-CM

## 2017-10-11 DIAGNOSIS — R0902 Hypoxemia: Secondary | ICD-10-CM

## 2017-10-11 DIAGNOSIS — R0789 Other chest pain: Secondary | ICD-10-CM | POA: Insufficient documentation

## 2017-10-11 DIAGNOSIS — E662 Morbid (severe) obesity with alveolar hypoventilation: Secondary | ICD-10-CM

## 2017-10-11 NOTE — Progress Notes (Signed)
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 Hypoxia R09.02   3. Obesity hypoventilation syndrome (HCC) E66.2   4. History of successful cardiopulmonary resuscitation Z92.89   5. Iron deficiency anemia, unspecified iron deficiency anemia type D50.9   6. Insomnia, unspecified type G47.00   7. Decreased pedal pulses R09.89     Patient is being discharged with home health services: none  Patient is being discharged with the following durable medical equipment:  none  Patient has been advised to f/u with their PCP in 1-2 weeks for a transitions of care visit. (Social services at their facility was responsible for arranging this appointment.)  Pt was provided with adequate prescriptions of noncontrolled medications to reach their scheduled appointment . For controlled substances, a limited supply was provided as appropriate for the individual patient. If the pt normally receives these medications from a pain clinic or has a contract with another physician, these medications should be received from that clinic or physician only.  Future labs/tests needed: f/u ABIs  TIME SPENT (MINUTES): Ocean. Perlie Gold    [facility] [address] [place], [place] [address] [phone] Cell (Monday-Friday 8 AM - 5 PM) [phone] After 5 PM and follow prompts

## 2017-10-19 ENCOUNTER — Inpatient Hospital Stay: Payer: Self-pay | Admitting: Pulmonary Disease

## 2018-04-17 ENCOUNTER — Ambulatory Visit: Payer: Self-pay | Admitting: Cardiology

## 2019-06-05 IMAGING — CT CT ABD-PELV W/ CM
2 of 5 series · 13 of 36 positions shown, 16 images · IV contrast (APPLIED)
Comparison: CT of the abdomen and pelvis on 09/16/2017 and chest
x-ray on 09/21/2017

CLINICAL DATA: [place] NOTES: Likely alcohol induced, CT scan consistent
with acute pancreatitis, no evidence of necrotizing pancreatitis.
Surrounding inflammatory changes to the duodenum.

EXAM:
CT CHEST, ABDOMEN, AND PELVIS WITH CONTRAST
TECHNIQUE: Multidetector CT imaging of the chest, abdomen and pelvis was
performed following the standard protocol during bolus
administration of intravenous contrast.
CONTRAST:  100mL 8ACQE4-HII IOPAMIDOL (8ACQE4-HII) INJECTION 61%

[Series 2: cap with · axial · 0.91mm/px · z∈[+1115,+1635]mm · 10 of 126 slices shown, 13 images]
[im 11/126  mediastinal]
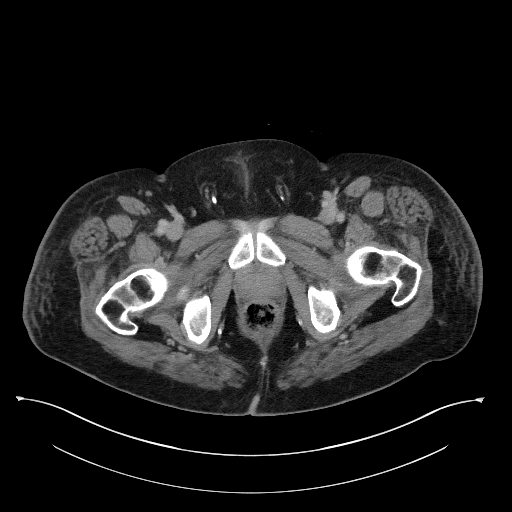
[im 11/126  lung]
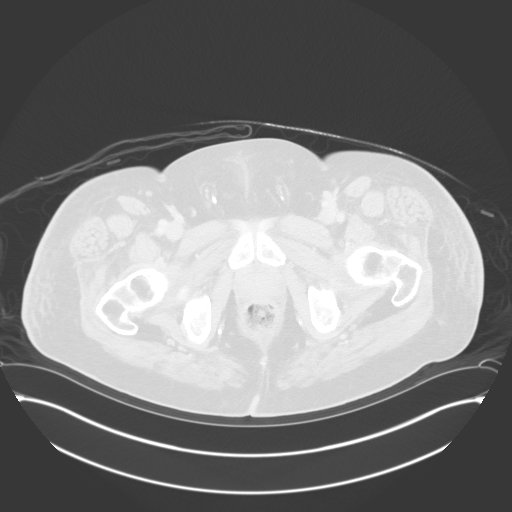
[im 21/126  lung]
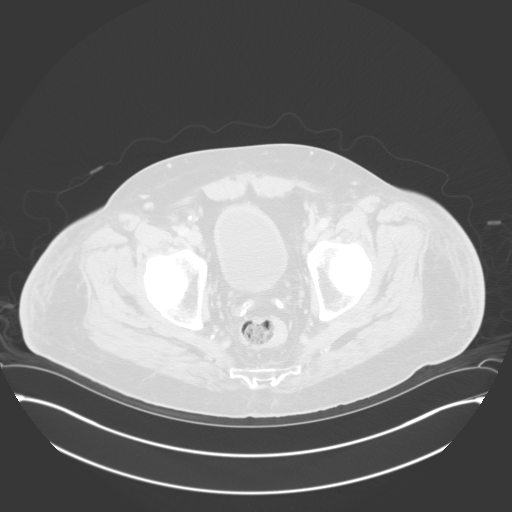
[im 32/126  lung]
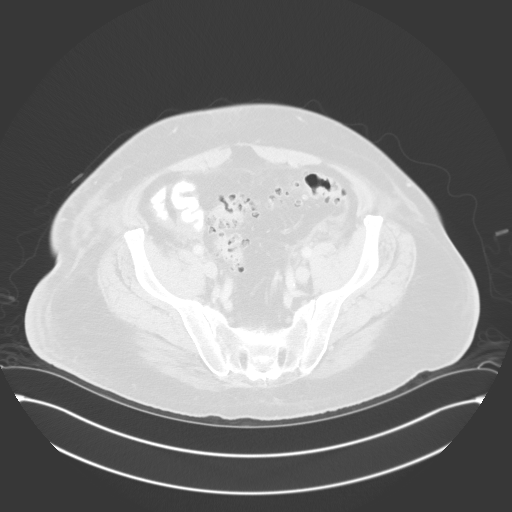
[im 42/126  lung]
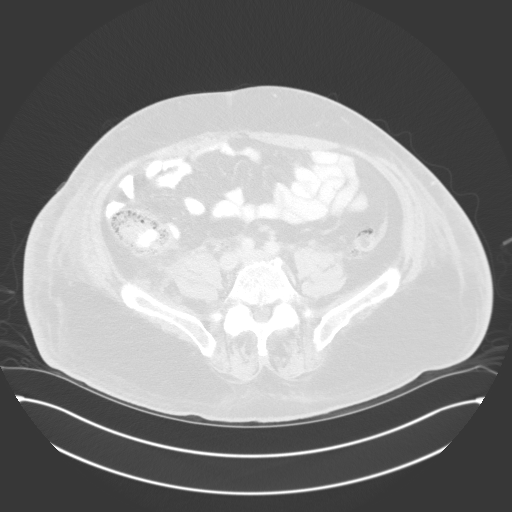
[im 53/126  mediastinal]
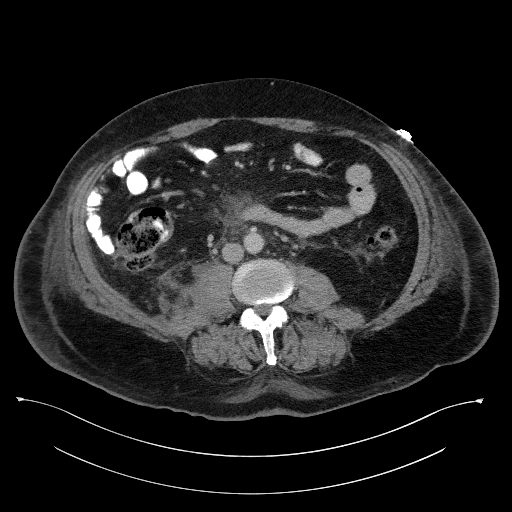
[im 53/126  lung]
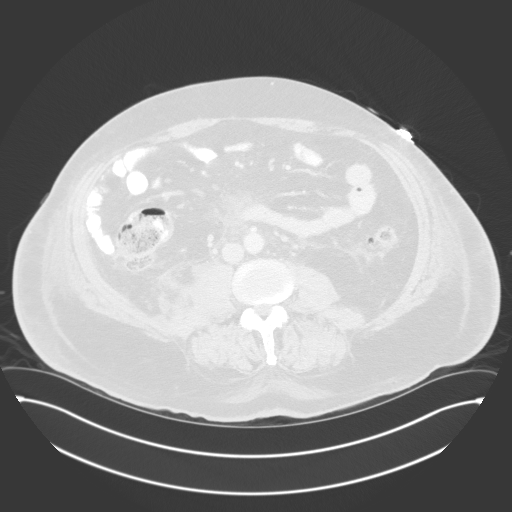
[im 73/126  lung]
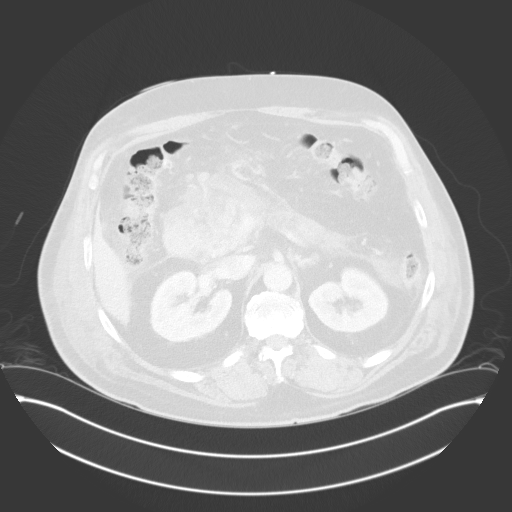
[im 84/126  lung]
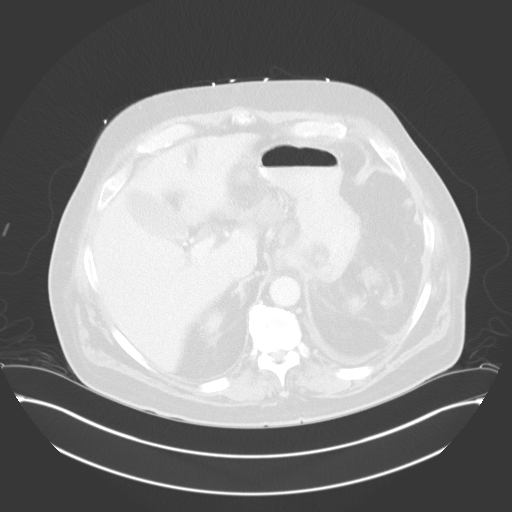
[im 94/126  lung]
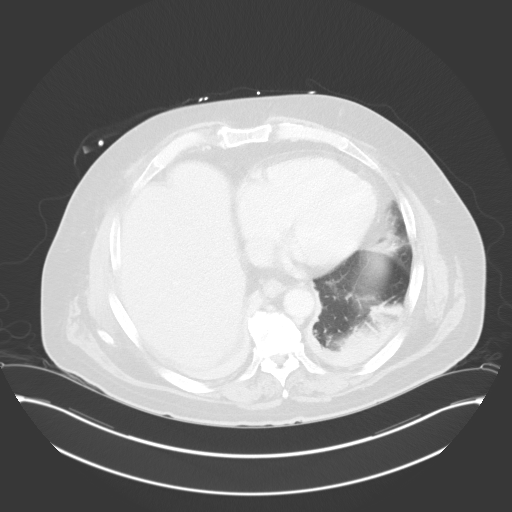
[im 105/126  mediastinal]
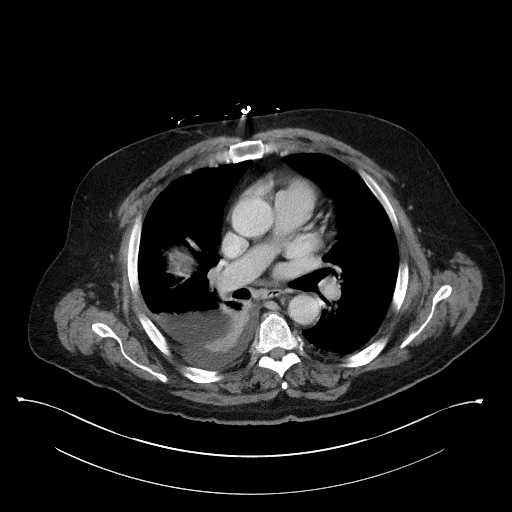
[im 105/126  lung]
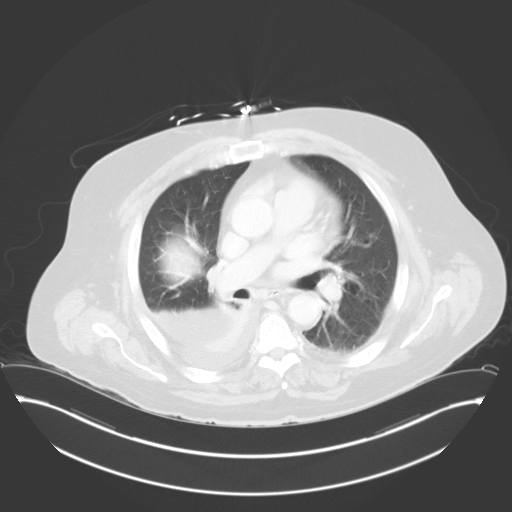
[im 115/126  lung]
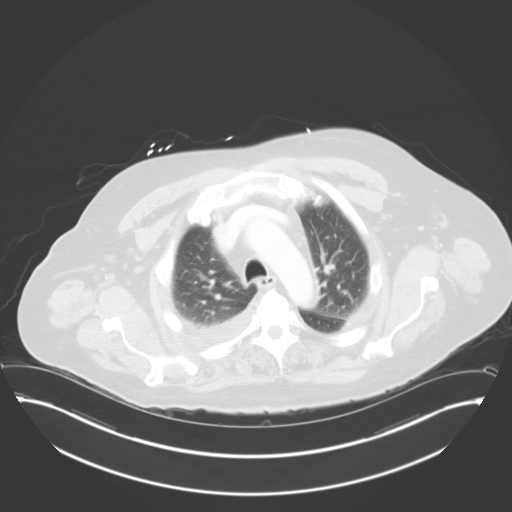

[Series 5: coronals · coronal · 0.90mm/px · 3 of 157 slices shown]
[im 32/157  lung]
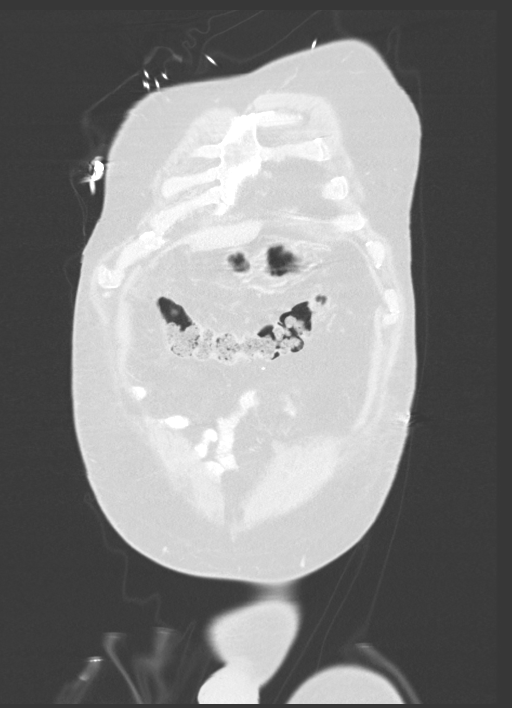
[im 63/157  lung]
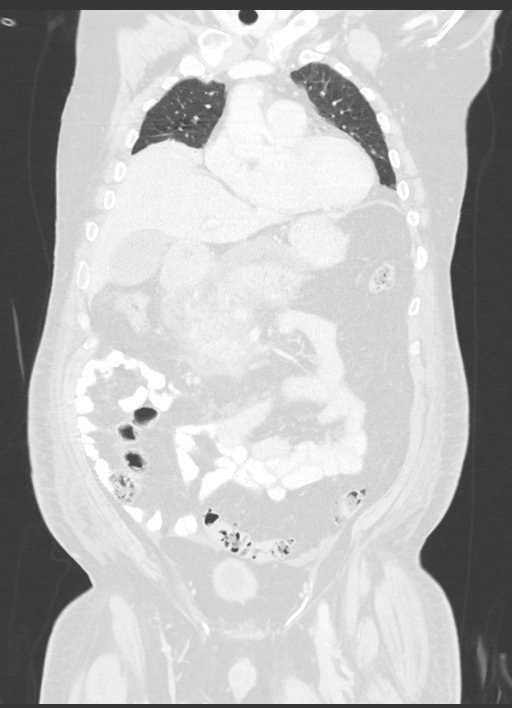
[im 94/157  lung]
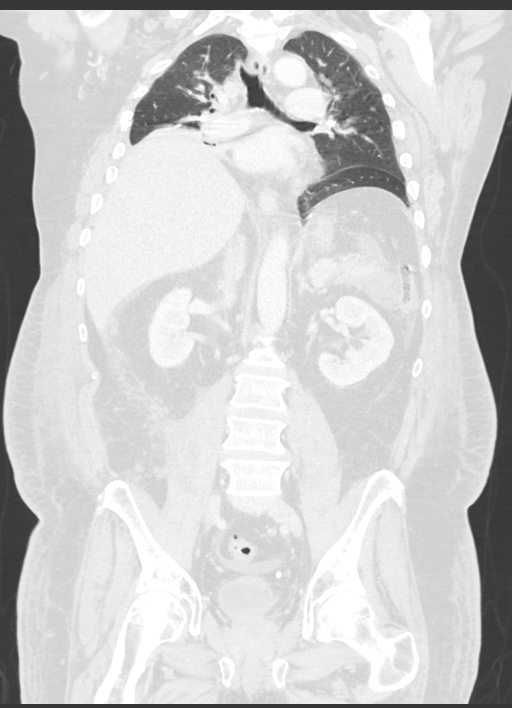

[13 of 36 positions shown; findings below may reference images not displayed]

FINDINGS: CT CHEST FINDINGS

Cardiovascular: Heart size is normal. Coronary artery calcifications
are present. There is minimal atherosclerotic calcification of the
thoracic aorta, not associated with aneurysm.

Mediastinum/Nodes: The visualized portion of the thyroid gland has a
normal appearance. LEFT thoracic inlet lymph node is 11 millimeters.
No suspicious hilar or axillary adenopathy.

Lungs/Pleura: There are bilateral pleural effusions. Bilateral LOWER
lobe consolidations, RIGHT greater than LEFT.

Musculoskeletal: Degenerative changes are seen in the spine. Remote
rib fractures.

CT ABDOMEN PELVIS FINDINGS

Hepatobiliary: The liver is homogeneous.  Gallbladder is present.

Pancreas: The pancreas is enlarged. There is significant. There is
increased. Pancreatic fluid compared with most recent exam. No
pseudocyst or abscess. There are small peripancreatic and mesenteric
lymph nodes. Fluid extends into the peritoneal reflection
inferiorly.

Spleen: Splenectomy.  LEFT UPPER QUADRANT splenosis.

Adrenals/Urinary Tract: Adrenal glands are normal in appearance.
There is symmetric enhancement and excretion from both kidneys. No
hydronephrosis. No suspicious renal mass. Urinary bladder is
unremarkable.

Stomach/Bowel: The stomach is normal in appearance. There is
thickening of the second and third portion of the duodenal, likely
secondary to pancreatitis. No small bowel obstruction or significant
wall thickening. There are numerous colonic diverticula. No acute
diverticulitis. The appendix is well seen and has a normal
appearance.

Vascular/Lymphatic: There is minimal atherosclerotic calcification
of the abdominal aorta. No aneurysm. Although involved by
atherosclerosis, there is vascular opacification of the celiac axis,
superior mesenteric artery, and inferior mesenteric artery. Normal
appearance of the portal venous system and inferior vena cava. Small
central abdominal mesenteric lymph nodes are likely reactive. Portal
vein and superior mesenteric vein are patent.

Reproductive: Prostatic calcifications.

Other: No abdominal wall hernia.  No ascites.  Abdominal wall edema.

Musculoskeletal: No acute or significant osseous findings.
IMPRESSION: 1. Enlarged inflamed pancreas. No evidence for pancreatic necrosis,
pseudocyst, or abscess.
2. Secondary inflammation of the duodenal.
3. Interval development of bilateral LOWER lobe consolidations and
small effusions, RIGHT greater than LEFT.
4. Coronary artery disease.
5.  Aortic atherosclerosis.  ([id number])
6. Anasarca.
7. Colonic diverticulosis.

## 2019-06-06 IMAGING — DX DG ABDOMEN 1V
2 series · 2 of 2 positions shown · non-contrast
Comparison: CT of the chest performed 09/23/2017

CLINICAL DATA: Orogastric tube placement.

EXAM:
ABDOMEN - 1 VIEW

[abdomen kub (1 of 2)]
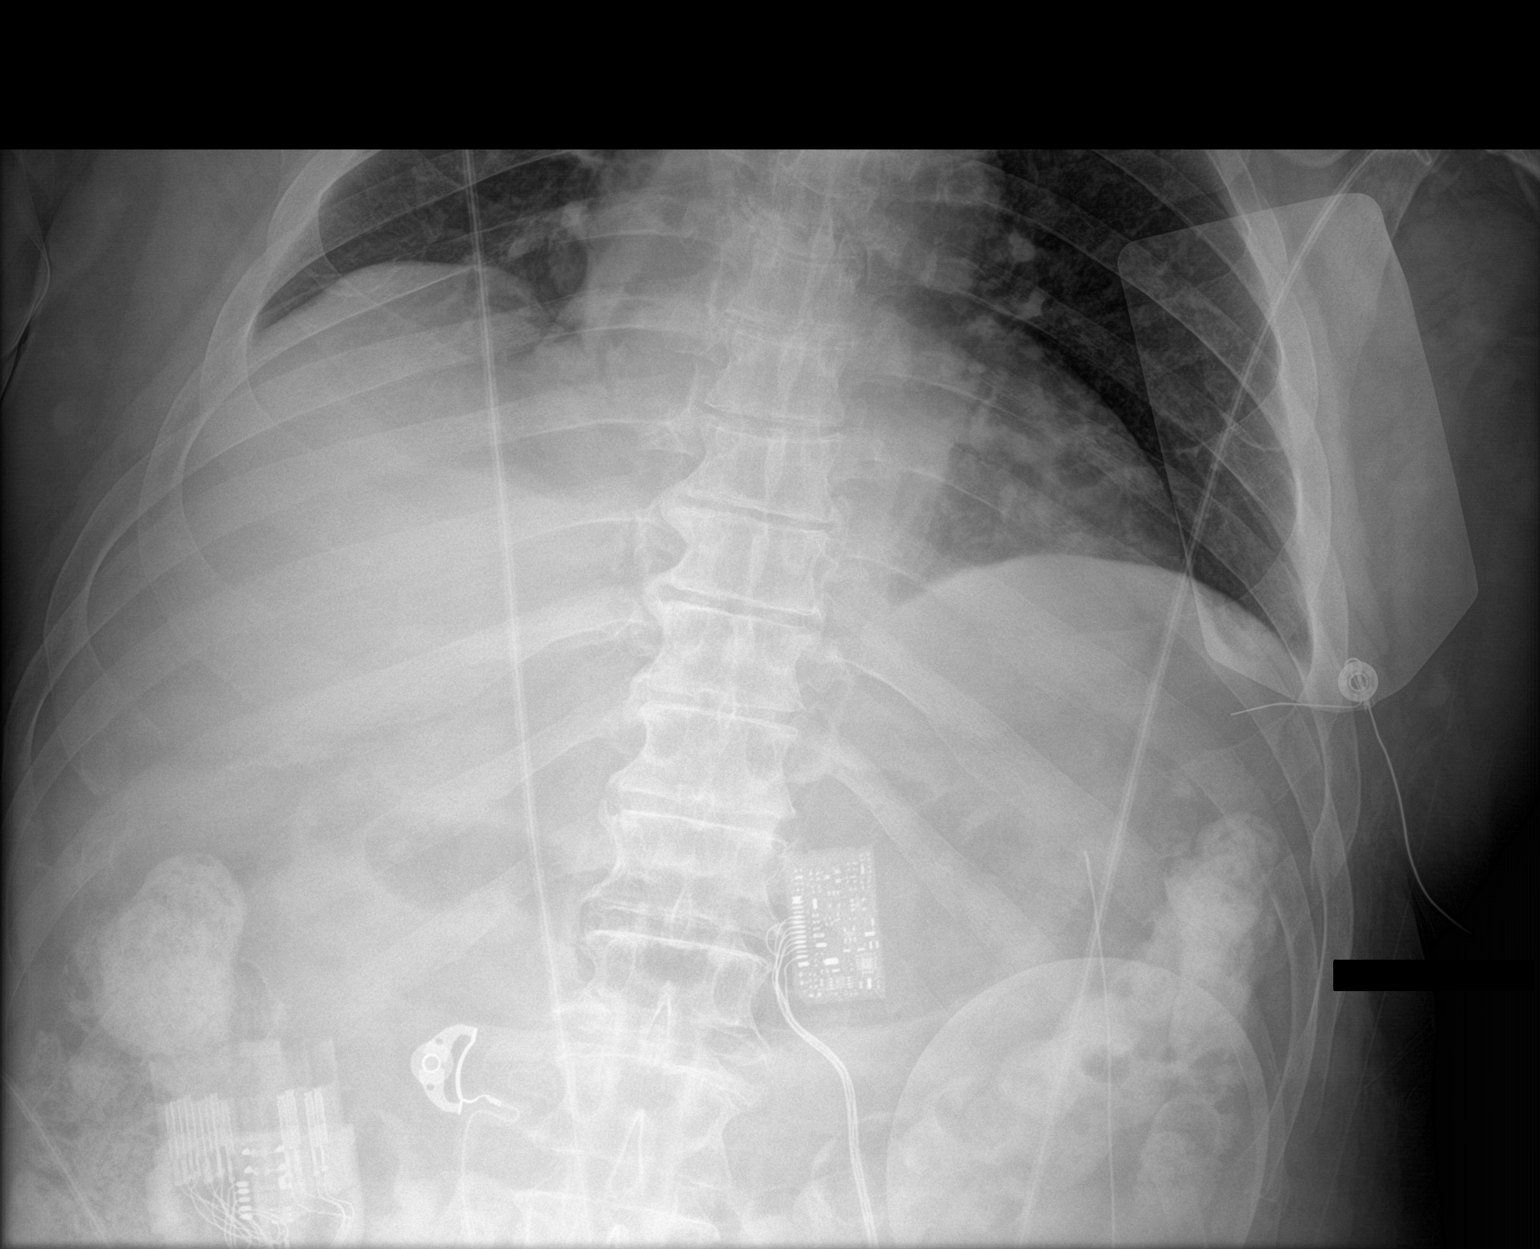

[abdomen kub (2 of 2)]
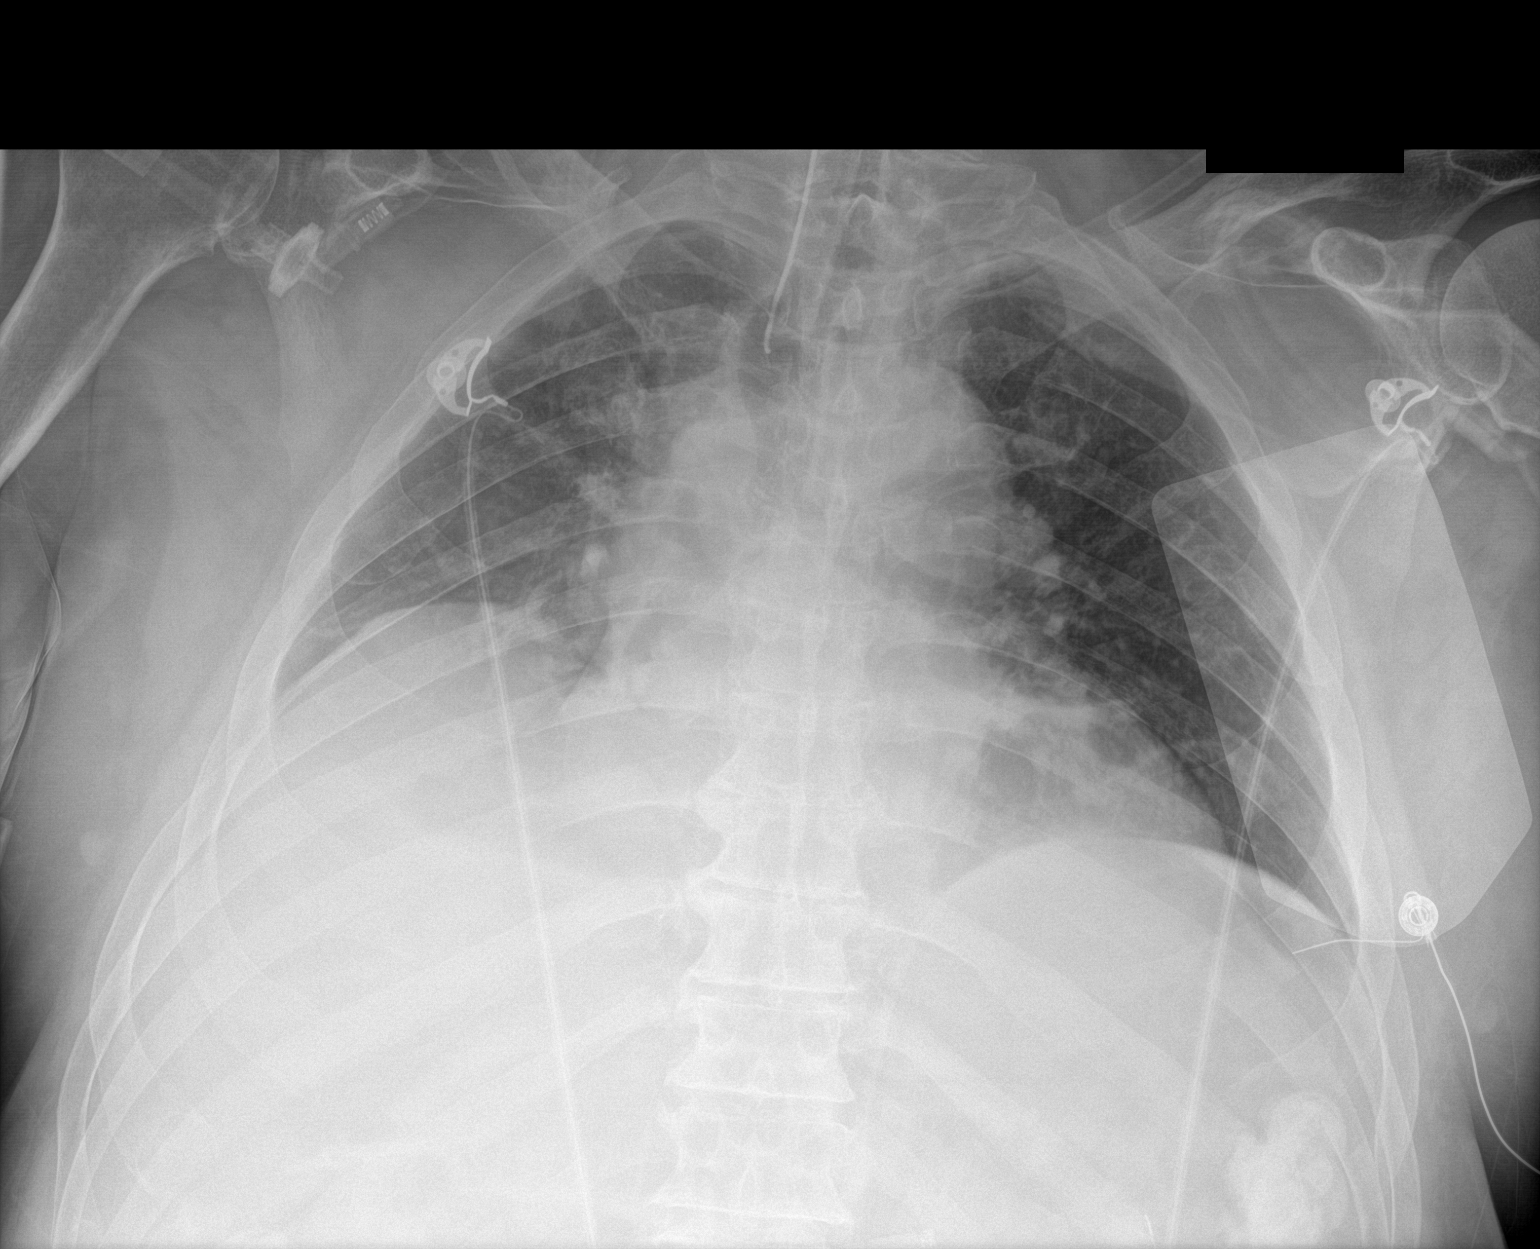

[2 of 2 positions shown; findings below may reference images not displayed]

FINDINGS: The orogastric tube is not visualized on this study, and may be
coiled within the patient's mouth.

The patient's endotracheal tube is seen ending 4-5 cm above the
carina. The lungs are hypoexpanded, with bilateral atelectasis. No
pleural effusion or pneumothorax is seen.

The cardiomediastinal silhouette is borderline normal in size. No
acute osseous abnormalities are identified. External pacing pads are
noted. The visualized bowel gas pattern is grossly unremarkable.
IMPRESSION: 1. Orogastric tube not visualized on this study; it may be coiled
within the patient's mouth.
2. Endotracheal tube seen ending 4-5 cm above the carina.
3. Lungs hypoexpanded, with bilateral atelectasis.

## 2019-06-07 IMAGING — DX DG CHEST 1V PORT
1 series · 1 of 1 positions shown · non-contrast
Comparison: 09/24/2017 chest x-ray.  09/23/2017 chest CT.

CLINICAL DATA: 61-year-old male with respiratory failure.
Subsequent encounter.

EXAM:
PORTABLE CHEST 1 VIEW

[chest ap]
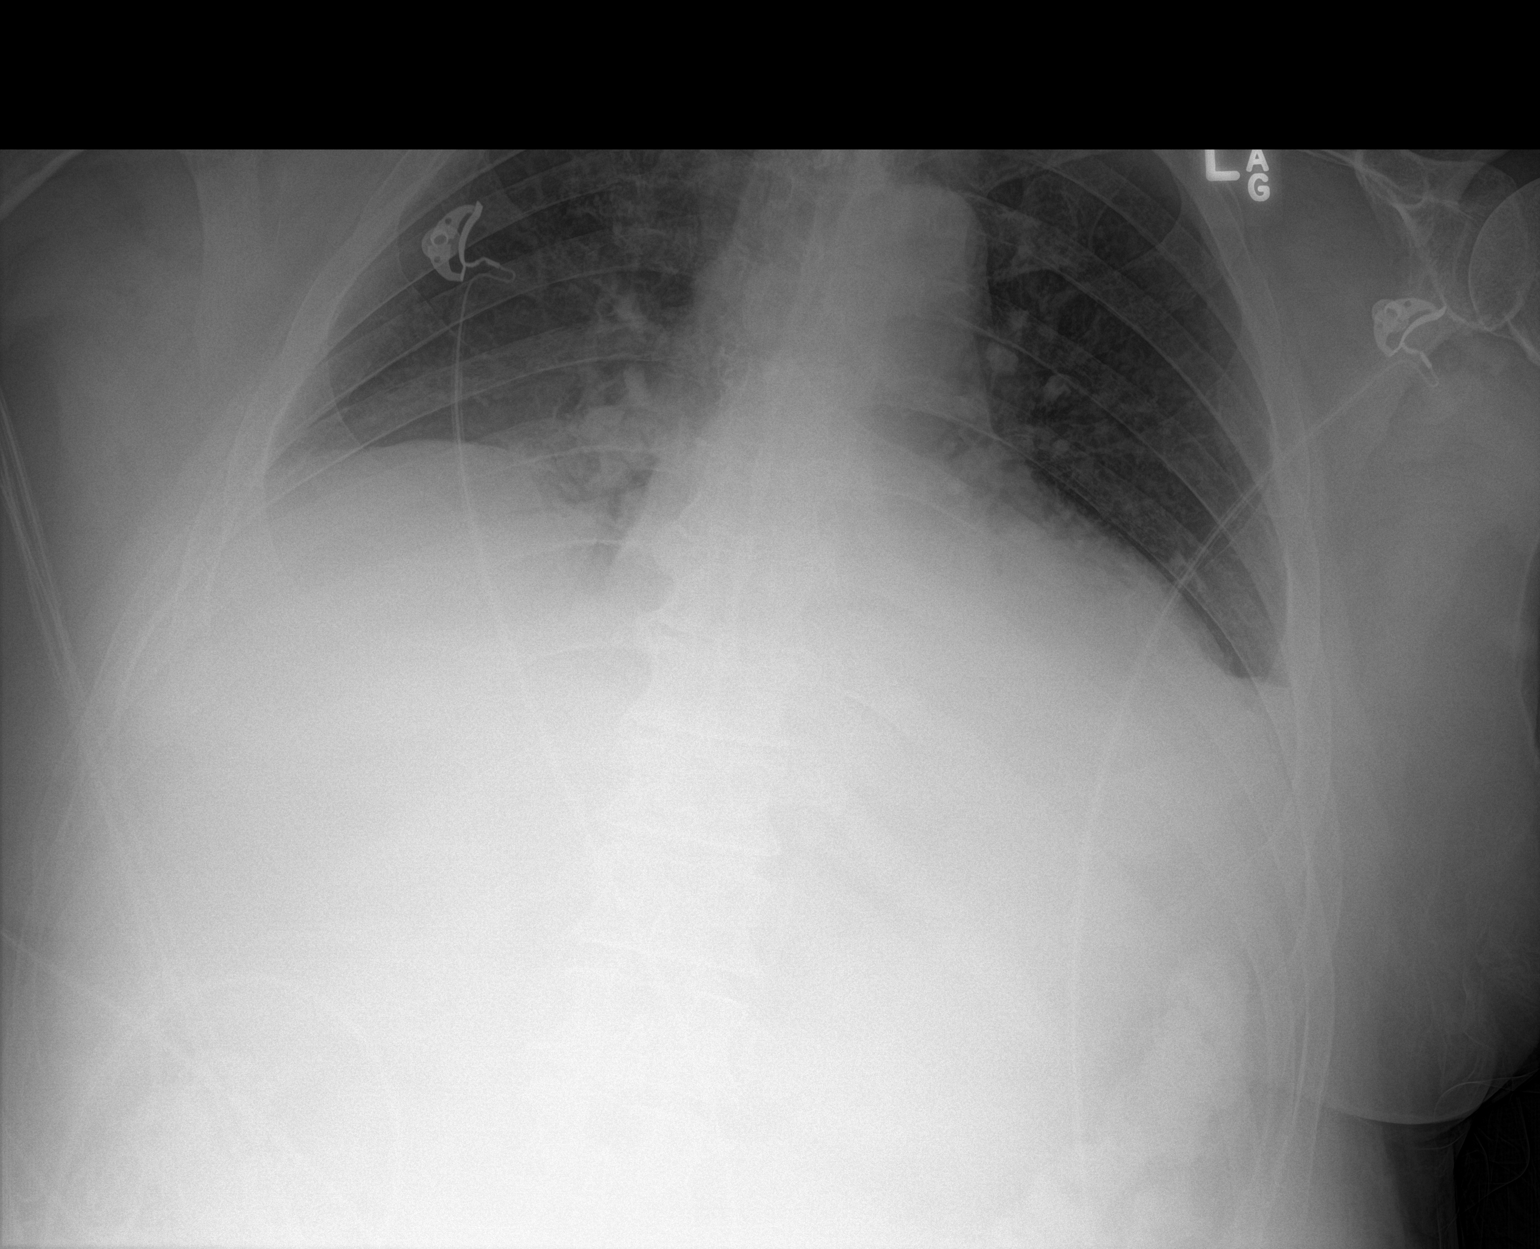

[1 of 1 positions shown; findings below may reference images not displayed]

FINDINGS: Endotracheal tube tip 4.7 cm above the carina. Right lung apex not
entirely included on present exam. No obvious pneumothorax.

Cardiomegaly.

Interval improved aeration right lung with persistent bilateral
lower lobe consolidation and pleural effusions, greater on the
right. Pulmonary vascular congestion greatest centrally.
IMPRESSION: 1. Interval improved aeration right lung. Persistent bilateral lower
lobe consolidation and pleural effusions, greater on right.
2. Pulmonary vascular congestion greatest centrally.
3. Cardiomegaly.

## 2019-10-06 ENCOUNTER — Inpatient Hospital Stay (HOSPITAL_COMMUNITY)
Admission: EM | Admit: 2019-10-06 | Discharge: 2019-10-09 | DRG: 440 | Disposition: A | Discharge status: Home / Self Care | Payer: Medicare Other | Attending: Internal Medicine | Admitting: Internal Medicine

## 2019-10-06 ENCOUNTER — Encounter (HOSPITAL_COMMUNITY): Payer: Self-pay | Admitting: Emergency Medicine

## 2019-10-06 ENCOUNTER — Emergency Department (HOSPITAL_COMMUNITY): Payer: Medicare Other

## 2019-10-06 DIAGNOSIS — F419 Anxiety disorder, unspecified: Secondary | ICD-10-CM | POA: Diagnosis present

## 2019-10-06 DIAGNOSIS — R7989 Other specified abnormal findings of blood chemistry: Secondary | ICD-10-CM

## 2019-10-06 DIAGNOSIS — F1010 Alcohol abuse, uncomplicated: Secondary | ICD-10-CM

## 2019-10-06 DIAGNOSIS — Z79899 Other long term (current) drug therapy: Secondary | ICD-10-CM

## 2019-10-06 DIAGNOSIS — Z87891 Personal history of nicotine dependence: Secondary | ICD-10-CM

## 2019-10-06 DIAGNOSIS — F1020 Alcohol dependence, uncomplicated: Secondary | ICD-10-CM | POA: Diagnosis present

## 2019-10-06 DIAGNOSIS — I10 Essential (primary) hypertension: Secondary | ICD-10-CM | POA: Diagnosis present

## 2019-10-06 DIAGNOSIS — Z885 Allergy status to narcotic agent status: Secondary | ICD-10-CM | POA: Diagnosis not present

## 2019-10-06 DIAGNOSIS — R1011 Right upper quadrant pain: Secondary | ICD-10-CM | POA: Diagnosis not present

## 2019-10-06 DIAGNOSIS — D72829 Elevated white blood cell count, unspecified: Secondary | ICD-10-CM

## 2019-10-06 DIAGNOSIS — K859 Acute pancreatitis without necrosis or infection, unspecified: Secondary | ICD-10-CM | POA: Diagnosis not present

## 2019-10-06 DIAGNOSIS — Z7409 Other reduced mobility: Secondary | ICD-10-CM | POA: Diagnosis present

## 2019-10-06 DIAGNOSIS — Z20822 Contact with and (suspected) exposure to covid-19: Secondary | ICD-10-CM | POA: Diagnosis present

## 2019-10-06 DIAGNOSIS — F329 Major depressive disorder, single episode, unspecified: Secondary | ICD-10-CM | POA: Diagnosis present

## 2019-10-06 DIAGNOSIS — Z981 Arthrodesis status: Secondary | ICD-10-CM | POA: Diagnosis not present

## 2019-10-06 DIAGNOSIS — K8020 Calculus of gallbladder without cholecystitis without obstruction: Secondary | ICD-10-CM | POA: Diagnosis present

## 2019-10-06 DIAGNOSIS — R739 Hyperglycemia, unspecified: Secondary | ICD-10-CM | POA: Diagnosis present

## 2019-10-06 DIAGNOSIS — R748 Abnormal levels of other serum enzymes: Secondary | ICD-10-CM | POA: Diagnosis present

## 2019-10-06 DIAGNOSIS — K852 Alcohol induced acute pancreatitis without necrosis or infection: Secondary | ICD-10-CM

## 2019-10-06 LAB — COMPREHENSIVE METABOLIC PANEL
ALT: 113 U/L — ABNORMAL HIGH (ref 0–44)
AST: 190 U/L — ABNORMAL HIGH (ref 15–41)
Albumin: 3.5 g/dL (ref 3.5–5.0)
Alkaline Phosphatase: 99 U/L (ref 38–126)
Anion gap: 11 (ref 5–15)
BUN: 6 mg/dL — ABNORMAL LOW (ref 8–23)
CO2: 21 mmol/L — ABNORMAL LOW (ref 22–32)
Calcium: 9.6 mg/dL (ref 8.9–10.3)
Chloride: 99 mmol/L (ref 98–111)
Creatinine, Ser: 0.9 mg/dL (ref 0.61–1.24)
GFR calc Af Amer: >60 mL/min (ref >60)
GFR calc non Af Amer: >60 mL/min (ref >60)
Glucose, Bld: 136 mg/dL — ABNORMAL HIGH (ref 70–99)
Potassium: 4.7 mmol/L (ref 3.5–5.1)
Sodium: 131 mmol/L — ABNORMAL LOW (ref 135–145)
Total Bilirubin: 1.9 mg/dL — ABNORMAL HIGH (ref 0.3–1.2)
Total Protein: 7.2 g/dL (ref 6.5–8.1)

## 2019-10-06 LAB — CBC WITH DIFFERENTIAL/PLATELET
Abs Immature Granulocytes: 0.04 10*3/uL (ref 0.00–0.07)
Basophils Absolute: 0.1 10*3/uL (ref 0.0–0.1)
Basophils Relative: 1 %
Eosinophils Absolute: 0.1 10*3/uL (ref 0.0–0.5)
Eosinophils Relative: 1 %
HCT: 47 % (ref 39.0–52.0)
Hemoglobin: 15.4 g/dL (ref 13.0–17.0)
Immature Granulocytes: 0 %
Lymphocytes Relative: 16 %
Lymphs Abs: 1.5 10*3/uL (ref 0.7–4.0)
MCH: 29.2 pg (ref 26.0–34.0)
MCHC: 32.8 g/dL (ref 30.0–36.0)
MCV: 89.2 fL (ref 80.0–100.0)
Monocytes Absolute: 0.9 10*3/uL (ref 0.1–1.0)
Monocytes Relative: 9 %
Neutro Abs: 6.7 10*3/uL (ref 1.7–7.7)
Neutrophils Relative %: 73 %
Platelets: UNDETERMINED 10*3/uL (ref 150–400)
RBC: 5.27 MIL/uL (ref 4.22–5.81)
RDW: 13.5 % (ref 11.5–15.5)
WBC: 9.2 10*3/uL (ref 4.0–10.5)
nRBC: 0 % (ref 0.0–0.2)

## 2019-10-06 LAB — SARS CORONAVIRUS 2 BY RT PCR (HOSPITAL ORDER, PERFORMED IN ~~LOC~~ HOSPITAL LAB): SARS Coronavirus 2: NEGATIVE

## 2019-10-06 LAB — LIPASE, BLOOD: Lipase: 324 U/L — ABNORMAL HIGH (ref 11–51)

## 2019-10-06 LAB — ETHANOL: Alcohol, Ethyl (B): <10 mg/dL (ref <10)

## 2019-10-06 MED ORDER — PANTOPRAZOLE SODIUM 40 MG IV SOLR
40.0000 mg | INTRAVENOUS | Status: DC
  Administered 2019-10-07 – 2019-10-08 (×2): 40 mg via INTRAVENOUS
  Filled 2019-10-06 (×3): qty 40

## 2019-10-06 MED ORDER — LORAZEPAM 2 MG/ML IJ SOLN
1.0000 mg | INTRAMUSCULAR | Status: DC | PRN
  Administered 2019-10-07: 2 mg via INTRAVENOUS
  Filled 2019-10-06: qty 1

## 2019-10-06 MED ORDER — DEXTROSE IN LACTATED RINGERS 5 % IV SOLN: INTRAVENOUS | Status: DC

## 2019-10-06 MED ORDER — LABETALOL HCL 5 MG/ML IV SOLN: 5.0000 mg | INTRAVENOUS | Status: DC | PRN

## 2019-10-06 MED ORDER — FENTANYL CITRATE (PF) 100 MCG/2ML IJ SOLN
50.0000 ug | Freq: Once | INTRAMUSCULAR | Status: AC
  Administered 2019-10-06: 50 ug via INTRAVENOUS
  Filled 2019-10-06: qty 2

## 2019-10-06 MED ORDER — IOHEXOL 300 MG/ML  SOLN
100.0000 mL | Freq: Once | INTRAMUSCULAR | Status: AC | PRN
  Administered 2019-10-06: 100 mL via INTRAVENOUS

## 2019-10-06 MED ORDER — THIAMINE HCL 100 MG PO TABS
100.0000 mg | ORAL_TABLET | Freq: Every day | ORAL | Status: DC
  Administered 2019-10-07 – 2019-10-09 (×3): 100 mg via ORAL
  Filled 2019-10-06 (×3): qty 1

## 2019-10-06 MED ORDER — ONDANSETRON HCL 4 MG/2ML IJ SOLN: 4.0000 mg | Freq: Four times a day (QID) | INTRAMUSCULAR | Status: DC | PRN

## 2019-10-06 MED ORDER — NICOTINE 21 MG/24HR TD PT24
21.0000 mg | MEDICATED_PATCH | Freq: Every day | TRANSDERMAL | Status: DC
  Administered 2019-10-07: 21 mg via TRANSDERMAL
  Filled 2019-10-06 (×3): qty 1

## 2019-10-06 MED ORDER — ENOXAPARIN SODIUM 40 MG/0.4ML ~~LOC~~ SOLN
40.0000 mg | SUBCUTANEOUS | Status: DC
  Administered 2019-10-07 – 2019-10-08 (×3): 40 mg via SUBCUTANEOUS
  Filled 2019-10-06 (×3): qty 0.4

## 2019-10-06 MED ORDER — FOLIC ACID 1 MG PO TABS
1.0000 mg | ORAL_TABLET | Freq: Every day | ORAL | Status: DC
  Administered 2019-10-07 – 2019-10-09 (×3): 1 mg via ORAL
  Filled 2019-10-06 (×3): qty 1

## 2019-10-06 MED ORDER — LORAZEPAM 1 MG PO TABS: 1.0000 mg | ORAL_TABLET | ORAL | Status: DC | PRN

## 2019-10-06 MED ORDER — MORPHINE SULFATE (PF) 4 MG/ML IV SOLN
4.0000 mg | Freq: Once | INTRAVENOUS | Status: AC
  Administered 2019-10-06: 4 mg via INTRAVENOUS
  Filled 2019-10-06: qty 1

## 2019-10-06 MED ORDER — THIAMINE HCL 100 MG/ML IJ SOLN: 100.0000 mg | Freq: Every day | INTRAMUSCULAR | Status: DC

## 2019-10-06 MED ORDER — SODIUM CHLORIDE 0.9 % IV BOLUS
1000.0000 mL | Freq: Once | INTRAVENOUS | Status: AC
  Administered 2019-10-06: 1000 mL via INTRAVENOUS

## 2019-10-06 MED ORDER — HYDROMORPHONE HCL 1 MG/ML IJ SOLN
0.5000 mg | INTRAMUSCULAR | Status: DC | PRN
  Administered 2019-10-06 – 2019-10-07 (×2): 1 mg via INTRAVENOUS
  Filled 2019-10-06 (×2): qty 1

## 2019-10-06 MED ORDER — ADULT MULTIVITAMIN W/MINERALS CH
1.0000 | ORAL_TABLET | Freq: Every day | ORAL | Status: DC
  Administered 2019-10-07 – 2019-10-09 (×3): 1 via ORAL
  Filled 2019-10-06 (×4): qty 1

## 2019-10-06 MED ORDER — ONDANSETRON HCL 4 MG PO TABS: 4.0000 mg | ORAL_TABLET | Freq: Four times a day (QID) | ORAL | Status: DC | PRN

## 2019-10-06 MED ORDER — SODIUM CHLORIDE 0.9 % IV SOLN: Freq: Once | INTRAVENOUS | Status: AC

## 2019-10-06 NOTE — ED Notes (Signed)
Patient requesting water--paged Dr Jonelle Sidle to [facility]
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
## 2019-10-06 NOTE — ED Provider Notes (Signed)
Patient signed out to me at 3 PM.  Lipase 324.  History of alcohol use.  Liver enzyme mildly elevated, bilirubin mildly elevated.  Suspect pancreatitis in the setting of alcohol use.  Getting CT scan of abdomen pelvis to further evaluate.  Patient NPO.  Given IV fluids and IV fentanyl.  Maintenance fluids have been started.  Anticipate admission for pancreatitis.  CT scan confirms acute pancreatitis. will admit for further care per  This chart was dictated using voice recognition software.  Despite best efforts to proofread,  errors can occur which can change the documentation meaning.     Lennice Sites, DO 10/06/19 Bosie Helper

## 2019-10-06 NOTE — ED Triage Notes (Signed)
Pt here from home with right upper quadrant pan , pt still has gall bladder and hx of pancreatitis . Pt admits to drinking about 6 beers over the last 24 hrs
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
## 2019-10-07 LAB — COMPREHENSIVE METABOLIC PANEL
ALT: 129 U/L — ABNORMAL HIGH (ref 0–44)
ALT: 133 U/L — ABNORMAL HIGH (ref 0–44)
AST: 142 U/L — ABNORMAL HIGH (ref 15–41)
AST: 146 U/L — ABNORMAL HIGH (ref 15–41)
Albumin: 3.2 g/dL — ABNORMAL LOW (ref 3.5–5.0)
Albumin: 3.3 g/dL — ABNORMAL LOW (ref 3.5–5.0)
Alkaline Phosphatase: 91 U/L (ref 38–126)
Alkaline Phosphatase: 88 U/L (ref 38–126)
Anion gap: 8 (ref 5–15)
Anion gap: 6 (ref 5–15)
BUN: <5 mg/dL — ABNORMAL LOW (ref 8–23)
BUN: 5 mg/dL — ABNORMAL LOW (ref 8–23)
CO2: 26 mmol/L (ref 22–32)
CO2: 26 mmol/L (ref 22–32)
Calcium: 9.1 mg/dL (ref 8.9–10.3)
Calcium: 8.9 mg/dL (ref 8.9–10.3)
Chloride: 102 mmol/L (ref 98–111)
Chloride: 103 mmol/L (ref 98–111)
Creatinine, Ser: 0.81 mg/dL (ref 0.61–1.24)
Creatinine, Ser: 0.8 mg/dL (ref 0.61–1.24)
GFR calc Af Amer: >60 mL/min (ref >60)
GFR calc Af Amer: >60 mL/min (ref >60)
GFR calc non Af Amer: >60 mL/min (ref >60)
GFR calc non Af Amer: >60 mL/min (ref >60)
Glucose, Bld: 127 mg/dL — ABNORMAL HIGH (ref 70–99)
Glucose, Bld: 192 mg/dL — ABNORMAL HIGH (ref 70–99)
Potassium: 3.9 mmol/L (ref 3.5–5.1)
Potassium: 4.6 mmol/L (ref 3.5–5.1)
Sodium: 136 mmol/L (ref 135–145)
Sodium: 135 mmol/L (ref 135–145)
Total Bilirubin: 1.3 mg/dL — ABNORMAL HIGH (ref 0.3–1.2)
Total Bilirubin: 1 mg/dL (ref 0.3–1.2)
Total Protein: 7 g/dL (ref 6.5–8.1)
Total Protein: 7.2 g/dL (ref 6.5–8.1)

## 2019-10-07 LAB — MAGNESIUM: Magnesium: 1.7 mg/dL (ref 1.7–2.4)

## 2019-10-07 LAB — CBC
HCT: 44.6 % (ref 39.0–52.0)
HCT: 45.1 % (ref 39.0–52.0)
Hemoglobin: 14.7 g/dL (ref 13.0–17.0)
Hemoglobin: 14.3 g/dL (ref 13.0–17.0)
MCH: 29.7 pg (ref 26.0–34.0)
MCH: 28.8 pg (ref 26.0–34.0)
MCHC: 33 g/dL (ref 30.0–36.0)
MCHC: 31.7 g/dL (ref 30.0–36.0)
MCV: 90.1 fL (ref 80.0–100.0)
MCV: 90.9 fL (ref 80.0–100.0)
Platelets: 205 10*3/uL (ref 150–400)
Platelets: 194 10*3/uL (ref 150–400)
RBC: 4.95 MIL/uL (ref 4.22–5.81)
RBC: 4.96 MIL/uL (ref 4.22–5.81)
RDW: 13.7 % (ref 11.5–15.5)
RDW: 14 % (ref 11.5–15.5)
WBC: 13.4 10*3/uL — ABNORMAL HIGH (ref 4.0–10.5)
WBC: 15.2 10*3/uL — ABNORMAL HIGH (ref 4.0–10.5)
nRBC: 0 % (ref 0.0–0.2)
nRBC: 0 % (ref 0.0–0.2)

## 2019-10-07 LAB — HIV ANTIBODY (ROUTINE TESTING W REFLEX): HIV Screen 4th Generation wRfx: NONREACTIVE

## 2019-10-07 LAB — LIPASE, BLOOD: Lipase: 80 U/L — ABNORMAL HIGH (ref 11–51)

## 2019-10-07 LAB — PHOSPHORUS: Phosphorus: 2.7 mg/dL (ref 2.5–4.6)

## 2019-10-07 MED ORDER — NALOXONE HCL 0.4 MG/ML IJ SOLN: 0.4000 mg | INTRAMUSCULAR | Status: DC | PRN

## 2019-10-07 MED ORDER — HYDROMORPHONE HCL 1 MG/ML IJ SOLN
0.5000 mg | INTRAMUSCULAR | Status: DC | PRN
  Administered 2019-10-07 – 2019-10-08 (×3): 0.5 mg via INTRAVENOUS
  Filled 2019-10-07 (×4): qty 1

## 2019-10-07 MED ORDER — NALOXONE HCL 0.4 MG/ML IJ SOLN
INTRAMUSCULAR | Status: AC
  Administered 2019-10-07: 0.4 mg
  Filled 2019-10-07: qty 1

## 2019-10-07 MED ORDER — METOPROLOL TARTRATE 5 MG/5ML IV SOLN
5.0000 mg | INTRAVENOUS | Status: DC | PRN
  Administered 2019-10-07: 5 mg via INTRAVENOUS
  Filled 2019-10-07: qty 5

## 2019-10-07 NOTE — Progress Notes (Signed)
PROGRESS NOTE  Alex Torres  DOB: Nov 21, 1955  PCP: Leonard Downing, MD VOJ:[id number]  DOA: 10/06/2019  LOS: 1 day   Chief complaint: Abdominal pain, nausea, vomiting.  Brief narrative: Patient is a 64 year old male with chronic alcoholism, hypertension, depression.   Patient presented to the ED on 8/16 with complaint of epigastric pain radiating to his back associated with significant vomiting.  No fever, hematemesis, hematochezia.   In the ED, blood pressure elevated to 171/108. Labs with lipase level more than 300.  WBC 9.2. CT abdomen pelvis showed acute pancreatitis above the head the pancreas without evidence of necrosis.  Patient was admitted to hospitalist service for further evaluation management..    Subjective:  Patient was seen and examined this morning. Sitting up at the edge of the bed.  Not in distress. Seems very weak.  Tremulous lower extremities when you nursing tech was helping him to urinate. No alcohol or tremors in hands.  Chart reviewed. No fever overnight. Heart rate mostly between 90-100.  Blood pressure in 160s and 170s Blood work this morning with elevated glucose level to 192, AST/ALT remains elevated, alk phos normal, lipase level improving. WBC count up to 15.2.  Assessment/Plan: Acute alcoholic pancreatitis -Presented with severe abdominal pain, nausea and vomiting in the setting of chronic alcohol use -CT abdomen finding as above.  -Lipase level elevated to 300.  Improving trend as below. -Continue IV hydration with D5 LR at 125 mill per hour. -Bowel rest, pain control, antiemetics. Recent Labs  Lab 10/06/19 0448 10/07/19 0320  LIPASE 324* 80*   Chronic alcoholism -Currently on CIWA protocol -Counseled to quit   Essential hypertension -Home meds include lisinopril 20 mg daily, currently on hold. -Continue to monitor blood pressure.  Impaired mobility -Seems very weak.  Lives alone.  Obtain PT eval.  Mobility: PT eval  pending Code Status:   Code Status: Full Code  Nutritional status: Body mass index is 32.34 kg/m.     Diet Order            Diet clear liquid Room service appropriate? Yes; Fluid consistency: Thin  Diet effective now                 DVT prophylaxis: enoxaparin (LOVENOX) injection 40 mg Start: 10/06/19 2200   Antimicrobials:  None Fluid: D5LR'@125'   Consultants: None Family Communication:  None at bedside  Status is: Inpatient  Remains inpatient appropriate because:Hemodynamically unstable, Ongoing diagnostic testing needed not appropriate for outpatient work up and IV treatments appropriate due to intensity of illness or inability to take PO   Dispo: The patient is from: Home              Anticipated d/c is to: Pending PT eval              Anticipated d/c date is: 2 days              Patient currently is not medically stable to d/c.       Infusions:  . dextrose 5% lactated ringers 125 mL/hr at 10/07/19 0821    Scheduled Meds: . enoxaparin (LOVENOX) injection  40 mg Subcutaneous Q24H  . folic acid  1 mg Oral Daily  . multivitamin with minerals  1 tablet Oral Daily  . nicotine  21 mg Transdermal Daily  . pantoprazole (PROTONIX) IV  40 mg Intravenous Q24H  . thiamine  100 mg Oral Daily   Or  . thiamine  100 mg Intravenous Daily  Antimicrobials: Anti-infectives (From admission, onward)   None      PRN meds: HYDROmorphone (DILAUDID) injection, labetalol, LORazepam **OR** LORazepam, metoprolol tartrate, naloxone, ondansetron **OR** ondansetron (ZOFRAN) IV   Objective: Vitals:   10/07/19 0425 10/07/19 0500  BP:  120/86  Pulse: (!) 101 89  Resp: 20 19  Temp:  97.6 F (36.4 C)  SpO2: 97% 97%    Intake/Output Summary (Last 24 hours) at 10/07/2019 1456 Last data filed at 10/07/2019 0900 Gross per 24 hour  Intake 644.02 ml  Output 200 ml  Net 444.02 ml   Filed Weights   10/06/19 0444 10/07/19 0050  Weight: 90.7 kg 97.9 kg   Weight change: 7.181  kg Body mass index is 32.34 kg/m.   Physical Exam: General exam: Appears calm and comfortable.  Not in physical distress Skin: No rashes, lesions or ulcers. HEENT: Atraumatic, normocephalic, supple neck, no obvious bleeding Lungs: Clear to auscultation bilaterally CVS: Rate and rhythm, no murmur GI/Abd soft, mild tenderness in the epigastrium, bowel sound present CNS: Alert, awake, oriented to place and person Psychiatry: Mood appropriate Extremities: No pedal edema, no calf tenderness  Data Review: I have personally reviewed the laboratory data and studies available.  Recent Labs  Lab 10/06/19 0448 10/07/19 0103 10/07/19 0320  WBC 9.2 13.4* 15.2*  NEUTROABS 6.7  --   --   HGB 15.4 14.7 14.3  HCT 47.0 44.6 45.1  MCV 89.2 90.1 90.9  PLT PLATELET CLUMPS NOTED ON SMEAR, UNABLE TO ESTIMATE 205 194   Recent Labs  Lab 10/06/19 0448 10/07/19 0103 10/07/19 0320  NA 131* 136 135  K 4.7 3.9 4.6  CL 99 102 103  CO2 21* 26 26  GLUCOSE 136* 127* 192*  BUN 6* <5* 5*  CREATININE 0.90 0.81 0.80  CALCIUM 9.6 9.1 8.9  MG  --  1.7  --   PHOS  --  2.7  --    No results found for: HGBA1C     Component Value Date/Time   CHOL 95 09/17/2017 0353   TRIG 124 09/24/2017 0536   HDL 51 09/17/2017 0353   CHOLHDL 1.9 09/17/2017 0353   VLDL 9 09/17/2017 0353   LDLCALC 35 09/17/2017 0353   Signed, Terrilee Croak, MD [facility] Pager: [phone] (Secure Chat preferred). 10/07/2019

## 2019-10-07 NOTE — Progress Notes (Signed)
RT note. RT was called to assess patient about possible CPAP due to low sats and being previously dx with OSA. Pt. Found on 3L sat 88%, I placed pt. On 4L [place] in mouth pt. Sat 94%. Pt. Was unable to be aroused without sternal rub and would only keep eyes open for a second. RT tried venti mask on 4L, pt. Dropped down to 80%.  Rapid, [place] was called. She suggested narcan. Pt. Woke up and sat 97% on 4L [place]. RT will continue to monitor.

## 2019-10-07 NOTE — Significant Event (Signed)
Rapid Response Event Note   Reason for Call :  Called by RT with concerns about breathing, low SpO2, and decreased mental status.  Initial Focused Assessment:  Pt laying in bed with eyes closed, with snoring respirations. Pt will open eyes to painful stimuli but goes back to sleep immediately. Pt will not speak or follow commands. Pupils 4 and sluggish. Lungs clear, diminished in bases. Skin warm and dry. T-97.6, HR-89, BP-120/86, RR-16, SpO2-94% on rL [place] placed in the patient's mouth.   Pt received 1mg  dilaudid at 2032 and 0052 and 2mg  ativan at 0051.   Interventions:  0.4mg  narcan given X 1-pt now awake, alert and oriented, moving all extremities, agitated.   Plan of Care:  Notify NP of need for narcan and need to possibly adjust pain/sedation medications. Continue to monitor pt closely. Call RRT if further assistance needed.    Event Summary:   MD Notified: Sharlet Salina, NP notified by bedside RN  Call [place] [phone]  Dillard Essex, RN

## 2019-10-07 NOTE — Progress Notes (Addendum)
Paged on call MD Dr. Sharlet Salina to inform of earlier bp 177/101 and current HR in 130's. I have order for labetalol but it has no parameters. Pt is currently sleeping. He has hx of sleep apnea and is apneic while sleeping but refuses CPAP. Pt is on CIWA protocol and received ativan 2mg  IV and dilaudid 1mg  IV at 0051. Central telemetry has called to report pt's HR as well but I was already aware and contacting MD.  Fabienne Bruns response from MD. 10/07/2019 @ 0245 Cyndi Bender, RN  Second page placed at [phone] still awaiting MD response. 10/07/2019 @ 0312 Cyndi Bender, RN   Dr. Sharlet Salina returned call to address heartrate in 130's. Gave orders for metoprolol. I informed her that I had placed pt on 2L [place] O2 due to his apnea and informed that I was going to call Respiratory due to pt's inabililty to maintain O2 sats. Metoprolol was administered with successful results in lowering HR 99-100. Respiratory arrived to unit to assess pt and after several different attempts with nasal canula  and venti mask being unsuccessful at raising pt's O2 sats they called a Rapid Response for patient due to decreased LOC and decreased O2 sats. RR RN Mindy arrived and placed orders for Narcan which was administered once with success. Pt became alert, oriented and was able to maintain O2 sats. He is currently agitated but alert. Dr. Sharlet Salina has been informed. No new orders received. 10/07/2019 @ [place], RN

## 2019-10-08 ENCOUNTER — Inpatient Hospital Stay (HOSPITAL_COMMUNITY): Payer: Medicare Other

## 2019-10-08 DIAGNOSIS — R1011 Right upper quadrant pain: Secondary | ICD-10-CM

## 2019-10-08 DIAGNOSIS — R7989 Other specified abnormal findings of blood chemistry: Secondary | ICD-10-CM

## 2019-10-08 DIAGNOSIS — D72829 Elevated white blood cell count, unspecified: Secondary | ICD-10-CM

## 2019-10-08 MED ORDER — OXYCODONE HCL 5 MG PO TABS
5.0000 mg | ORAL_TABLET | ORAL | Status: DC | PRN
  Administered 2019-10-08 – 2019-10-09 (×4): 5 mg via ORAL
  Filled 2019-10-08 (×4): qty 1

## 2019-10-08 MED ORDER — TRAMADOL HCL 50 MG PO TABS: 50.0000 mg | ORAL_TABLET | Freq: Four times a day (QID) | ORAL | Status: DC | PRN

## 2019-10-08 MED ORDER — HYDROMORPHONE HCL 1 MG/ML IJ SOLN: 0.5000 mg | INTRAMUSCULAR | Status: DC | PRN

## 2019-10-08 MED ORDER — AMLODIPINE BESYLATE 10 MG PO TABS
10.0000 mg | ORAL_TABLET | Freq: Every day | ORAL | Status: DC
  Administered 2019-10-08 – 2019-10-09 (×2): 10 mg via ORAL
  Filled 2019-10-08 (×2): qty 1

## 2019-10-08 MED ORDER — POLYETHYLENE GLYCOL 3350 17 G PO PACK
17.0000 g | PACK | Freq: Two times a day (BID) | ORAL | Status: DC
  Administered 2019-10-08 – 2019-10-09 (×3): 17 g via ORAL
  Filled 2019-10-08 (×3): qty 1

## 2019-10-08 MED ORDER — SENNOSIDES-DOCUSATE SODIUM 8.6-50 MG PO TABS
2.0000 | ORAL_TABLET | Freq: Two times a day (BID) | ORAL | Status: DC
  Administered 2019-10-08 – 2019-10-09 (×3): 2 via ORAL
  Filled 2019-10-08 (×3): qty 2

## 2019-10-08 NOTE — Plan of Care (Signed)
  Problem: Education: Goal: Knowledge of General Education information will improve Description: Including pain rating scale, medication(s)/side effects and non-pharmacologic comfort measures 10/08/2019 0237 by Kennith Center, RN Outcome: Progressing 10/08/2019 0237 by Kennith Center, RN Outcome: Progressing   Problem: Health Behavior/Discharge Planning: Goal: Ability to manage health-related needs will improve Outcome: Progressing   Problem: Activity: Goal: Risk for activity intolerance will decrease Outcome: Progressing   Problem: Nutrition: Goal: Adequate nutrition will be maintained Outcome: Progressing

## 2019-10-08 NOTE — TOC Initial Note (Signed)
Transition of Care [facility]) - Initial/Assessment Note    Patient Details  Name: Alex Torres MRN: [id number] Date of Birth: 1955-03-01  [facility] CM/SW Contact:    Marilu Favre, RN Phone Number: 10/08/2019, 2:03 PM  Clinical Narrative:                  Spoke to patient at bedside. Confirmed face sheet information.  Patient from home alone. Discussed PT recommendation for HHPT. Patient voiced understanding. He has had HHPT in past and at this time declining. If he changes his mind he will call his PCP to arrange. Expected Discharge Plan: Home/Self Care Barriers to Discharge: Continued Medical Work up   Patient Goals and CMS Choice Patient states their goals for this hospitalization and ongoing recovery are:: to return to home CMS Medicare.gov Compare Post Acute Care list provided to:: Patient Choice offered to / list presented to : Patient  Expected Discharge Plan and Services Expected Discharge Plan: Home/Self Care   Discharge Planning Services: CM Consult Post Acute Care Choice: [place] arrangements for the past 2 months: Single Family Home                 DME Arranged: N/A DME Agency: NA       HH Arranged: Patient Refused HH          Prior Living Arrangements/Services Living arrangements for the past 2 months: Single Family Home Lives with:: Self Patient language and need for interpreter reviewed:: Yes Do you feel safe going back to the place where you live?: Yes      Need for Family Participation in Patient Care: Yes (Comment) Care giver support system in place?: Yes (comment)   Criminal Activity/Legal Involvement Pertinent to Current Situation/Hospitalization: No - Comment as needed  Activities of Daily Living      Permission Sought/Granted   Permission granted to share information with : No              Emotional Assessment Appearance:: Appears stated age Attitude/Demeanor/Rapport: Engaged Affect (typically  observed): Accepting Orientation: : Oriented to Self, Oriented to Place, Oriented to Situation, Oriented to  Time Alcohol / Substance Use: Not Applicable Psych Involvement: No (comment)  Admission diagnosis:  Pancreatitis, acute [K85.90] Alcohol-induced acute pancreatitis, unspecified complication status [F64.33] Patient Active Problem List   Diagnosis Date Noted  . Pancreatitis, acute 10/06/2019  . Essential hypertension, benign 10/11/2017  . Chest pain, muscular 10/11/2017  . Alcoholism ([place]) 10/11/2017  . Aspiration pneumonia ([place]) 10/11/2017  . Acute respiratory failure with hypoxia and hypercapnia ([place]) 09/19/2017  . Encephalopathy acute 09/19/2017  . Acute pancreatitis 09/16/2017  . Myelopathy ([place]) 01/17/2017   PCP:  Leonard Downing, MD Pharmacy:   [facility], [place] - [address] [place] [address] Phone: [phone] Fax: [phone]     Social Determinants of Health (SDOH) Interventions    Readmission Risk Interventions No flowsheet data found.

## 2019-10-08 NOTE — Progress Notes (Signed)
[facility]  PROGRESS NOTE  JADIS MIKA WGY:[id number] DOB: 1955/09/28 DOA: 10/06/2019 PCP: Leonard Downing, MD Admit date - 10/06/2019   Admitting Physician Elwyn Reach, MD  Outpatient Primary MD for the patient is Leonard Downing, MD  LOS - 2 Brief Narrative   DANTRELL SCHERTZER is a 64 y.o. year old male with medical history significant for chronic alcoholism, HTN who presented on 10/06/2019 with worsening epigastric abdominal pain, vomiting and was found to have alcohol induced pancreatitis.Marland Kitchen  Hospital course complicated by elevated LFTs  Subjective  Today feels his pain is relatively well controlled with IV pain regimen.  Has done okay liquid diet without any nausea or vomiting  A & P  Acute pancreatitis, suspect alcohol induced.  Admits to being chronic heavy drinker with prior history of pancreatitis.  Seems to be improving as patient is able to tolerate clear liquids.  Given right upper quadrant tenderness and elevated LFTs would like to rule out any gallbladder/liver pathology -Plan for right upper quadrant ultrasound -Continue IV fluids -Judicious use of pain control, added oxycodone 5 mg every 4 hours as needed moderate pain, and Dilaudid use for only breakthrough pain, and tramadol for mild pain  Chronic alcohol abuse.  Acknowledges that he needs to stop drinking.  No current signs or symptoms of withdrawal -TOC consulted -Monitor CIWA protocol, as needed Ativan -Continue folic acid, thiamine, multivitamin  Elevated LFTs.  AST and ALT both greater than 100.  No abnormalities in alk phos or total bili.  With right upper quadrant tenderness would like to rule out any gallbladder etiology.  Hepatic steatosis was noted on CT abdomen -Right upper quadrant ultrasound -Check hepatitis panel  Leukocytosis, slightly worsening.  Up from 13-15.  Remains afebrile. -Monitor CBC, add antibiotics if becomes febrile.  Blood pressure elevated in the 160s -Resume home   amlodipine     Family Communication  : None  Code Status : Full  Disposition Plan  :  Patient is from home. Anticipated d/c date:  1 to 2 days. Barriers to d/c or necessity for inpatient status: Once able to tolerate oral diet, no longer requires IV pain medicine control, and normal/nonacute abdominal ultrasound findings  Consults  : None  Procedures  : Pending right upper quadrant ultrasound  DVT Prophylaxis  :  Lovenox  Lab Results  Component Value Date   PLT 194 10/07/2019    Diet :  Diet Order            Diet full liquid Room service appropriate? Yes; Fluid consistency: Thin  Diet effective now                  Inpatient Medications Scheduled Meds: . enoxaparin (LOVENOX) injection  40 mg Subcutaneous Q24H  . folic acid  1 mg Oral Daily  . multivitamin with minerals  1 tablet Oral Daily  . nicotine  21 mg Transdermal Daily  . pantoprazole (PROTONIX) IV  40 mg Intravenous Q24H  . polyethylene glycol  17 g Oral BID  . senna-docusate  2 tablet Oral BID  . thiamine  100 mg Oral Daily   Or  . thiamine  100 mg Intravenous Daily   Continuous Infusions: . dextrose 5% lactated ringers 125 mL/hr at 10/08/19 0844   PRN Meds:.HYDROmorphone (DILAUDID) injection, labetalol, LORazepam **OR** LORazepam, metoprolol tartrate, naloxone, ondansetron **OR** ondansetron (ZOFRAN) IV, oxyCODONE, traMADol  Antibiotics  :   Anti-infectives (From admission, onward)   None  Objective   Vitals:   10/07/19 1958 10/08/19 0000 10/08/19 0500 10/08/19 1332  BP: (!) 160/98  120/86 (!) 162/90  Pulse: 93 90 89 88  Resp: '19  19 16  ' Temp: 98.3 F (36.8 C)  97.6 F (36.4 C) 98.1 F (36.7 C)  TempSrc: Oral  Oral Oral  SpO2: 100%  97% 98%  Weight:      Height:        SpO2: 98 % O2 Flow Rate (L/min): 4 L/min  Wt Readings from Last 3 Encounters:  10/07/19 97.9 kg  10/11/17 89.9 kg  10/04/17 85 kg     Intake/Output Summary (Last 24 hours) at 10/08/2019 1533 Last data  filed at 10/08/2019 1500 Gross per 24 hour  Intake 4822.99 ml  Output 2150 ml  Net 2672.99 ml    Physical Exam:     Awake Alert, Oriented X 3, Normal affect No new F.N deficits,  [place].AT, Normal respiratory effort on room air, CTAB RRR,No Gallops,Rubs or new Murmurs,  Abdomen soft, slightly tender in right upper quadrant, no rebound tenderness or guarding, bowel sounds present No Cyanosis, No new Rash or bruise     I have personally reviewed the following:   Data Reviewed:  CBC Recent Labs  Lab 10/06/19 0448 10/07/19 0103 10/07/19 0320  WBC 9.2 13.4* 15.2*  HGB 15.4 14.7 14.3  HCT 47.0 44.6 45.1  PLT PLATELET CLUMPS NOTED ON SMEAR, UNABLE TO ESTIMATE 205 194  MCV 89.2 90.1 90.9  MCH 29.2 29.7 28.8  MCHC 32.8 33.0 31.7  RDW 13.5 13.7 14.0  LYMPHSABS 1.5  --   --   MONOABS 0.9  --   --   EOSABS 0.1  --   --   BASOSABS 0.1  --   --     Chemistries  Recent Labs  Lab 10/06/19 0448 10/07/19 0103 10/07/19 0320  NA 131* 136 135  K 4.7 3.9 4.6  CL 99 102 103  CO2 21* 26 26  GLUCOSE 136* 127* 192*  BUN 6* <5* 5*  CREATININE 0.90 0.81 0.80  CALCIUM 9.6 9.1 8.9  MG  --  1.7  --   AST 190* 142* 146*  ALT 113* 129* 133*  ALKPHOS 99 91 88  BILITOT 1.9* 1.3* 1.0   ------------------------------------------------------------------------------------------------------------------ No results for input(s): CHOL, HDL, LDLCALC, TRIG, CHOLHDL, LDLDIRECT in the last 72 hours.  No results found for: HGBA1C ------------------------------------------------------------------------------------------------------------------ No results for input(s): TSH, T4TOTAL, T3FREE, THYROIDAB in the last 72 hours.  Invalid input(s): FREET3 ------------------------------------------------------------------------------------------------------------------ No results for input(s): VITAMINB12, FOLATE, FERRITIN, TIBC, IRON, RETICCTPCT in the last 72 hours.  Coagulation profile No results for  input(s): INR, PROTIME in the last 168 hours.  No results for input(s): DDIMER in the last 72 hours.  Cardiac Enzymes No results for input(s): CKMB, TROPONINI, MYOGLOBIN in the last 168 hours.  Invalid input(s): CK ------------------------------------------------------------------------------------------------------------------ No results found for: BNP  Micro Results Recent Results (from the past 240 hour(s))  SARS Coronavirus 2 by RT PCR (hospital order, performed in [facility] hospital lab) Nasopharyngeal Nasopharyngeal Swab     Status: None   Collection Time: 10/06/19  4:12 PM   Specimen: Nasopharyngeal Swab  Result Value Ref Range Status   SARS Coronavirus 2 NEGATIVE NEGATIVE Final    Comment: (NOTE) SARS-CoV-2 target nucleic acids are NOT DETECTED.  The SARS-CoV-2 RNA is generally detectable in upper and lower respiratory specimens during the acute phase of infection. The lowest concentration of SARS-CoV-2 viral copies this assay  can detect is 250 copies / mL. A negative result does not preclude SARS-CoV-2 infection and should not be used as the sole basis for treatment or other patient management decisions.  A negative result may occur with improper specimen collection / handling, submission of specimen other than nasopharyngeal swab, presence of viral mutation(s) within the areas targeted by this assay, and inadequate number of viral copies (<250 copies / mL). A negative result must be combined with clinical observations, patient history, and epidemiological information.  Fact Sheet for Patients:   StrictlyIdeas.no  Fact Sheet for Healthcare Providers: BankingDealers.co.za  This test is not yet approved or  cleared by the [place] FDA and has been authorized for detection and/or diagnosis of SARS-CoV-2 by FDA under an Emergency Use Authorization (EUA).  This EUA will remain in effect (meaning this test can be used)  for the duration of the COVID-19 declaration under Section 564(b)(1) of the Act, 21 U.S.C. section 360bbb-3(b)(1), unless the authorization is terminated or revoked sooner.  Performed at [facility], [place] [address], [place], [place] [address]     Radiology Reports CT Abdomen Pelvis W Contrast  Result Date: 10/06/2019 CLINICAL DATA:  Abdominal abscess/infection suspected. Right lower abdominal pain since yesterday. EXAM: CT ABDOMEN AND PELVIS WITH CONTRAST TECHNIQUE: Multidetector CT imaging of the abdomen and pelvis was performed using the standard protocol following bolus administration of intravenous contrast. CONTRAST:  165m OMNIPAQUE IOHEXOL 300 MG/ML  SOLN COMPARISON:  None. FINDINGS: Lower chest: Bases are clear. Heart size is normal. Coronary artery calcifications are present. No significant pericardial effusion present. Hepatobiliary: Diffuse fatty infiltration of the liver is noted. No discrete lesions are present. Common bile duct is within normal limits. Gas is present within the gallbladder. Pancreas: Inflammatory changes are present about the head and uncinate process. No mass or is cystic lesion is present. Atrophy of the body and tail is noted with some calcification. Spleen: Splenectomy.  Left upper quadrant splenosis is stable. Adrenals/Urinary Tract: Adrenal glands are within normal limits. Kidneys are unremarkable. No stone or mass lesion is present. Ureters and urinary bladder are within normal. Stomach/Bowel: Stomach is within normal limits. Inflammatory changes are present throughout the duodenum, likely secondary to pancreatitis. Distal duodenum is within normal limits. Small bowel is unremarkable. Terminal ileum is within normal limits. 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 No focal inflammation present to suggest diverticulitis. Vascular/Lymphatic: Minimal atherosclerotic calcifications are present in the aorta and branch vessels. No aneurysm is present.  Reproductive: Prostate calcifications are stable. Other: Fluid tracks in the retroperitoneum, right greater than left. No significant retroperitoneal adenopathy is present. Musculoskeletal: Vacuum disc at L4-5 has progressed. Remote superior endplate fractures present at L1. No focal lytic or blastic lesions are present. No acute or healing fractures are present. Bony pelvis is within. The hips are located and within limits. IMPRESSION: 1. Inflammatory changes about the head and uncinate process of the pancreas compatible with acute pancreatitis. No cyst or abscess is associated. 2. Inflammatory changes throughout the duodenum, likely secondary to pancreatitis. 3. Gas within the gallbladder is intraluminal. Question prior sphincterotomy. 4. Hepatic steatosis. 5. Splenectomy. 6. Stable splenosis. 7. Progressive degenerative changes in the lumbar spine. 8. Aortic Atherosclerosis (ICD10-I70.0). Electronically Signed   By: CSan MorelleM.D.   On: 10/06/2019 16:28     Time Spent in minutes  30     SDesiree HaneM.D on 10/08/2019 at 3:33 PM  To page go to [email or web address] - password [facility]

## 2019-10-08 NOTE — Evaluation (Signed)
Physical Therapy Evaluation Patient Details Name: Alex Torres MRN: [id number] DOB: Feb 18, 1956 Today's Date: 10/08/2019   History of Present Illness  Alex Torres is a 64 y.o. male with medical history significant of alcoholism, hypertension, depression, who presented with abdominal pain nausea and vomiting. Patient admitted with alcoholic pancreatitis.  Clinical Impression  Patient presents with decreased mobility due to generalized weakness, decreased balance and he will benefit from skilled PT in the acute setting to allow return home at d/c.   He lives alone so prefer some HHPT follow up, though pt currently declining.  Will follow up acutely to ensure safety on steps for home entry.     Follow Up Recommendations Home health PT (pt likely to decline [facility] services)    Equipment Recommendations  None recommended by PT    Recommendations for Other Services       Precautions / Restrictions Precautions Precautions: Fall      Mobility  Bed Mobility Overal bed mobility: Modified Independent                Transfers Overall transfer level: Needs assistance Equipment used: Rolling walker (2 wheeled) Transfers: Sit to/from Stand Sit to Stand: Supervision         General transfer comment: assist for lines/safety  Ambulation/Gait Ambulation/Gait assistance: Supervision Gait Distance (Feet): 200 Feet Assistive device: Rolling walker (2 wheeled) Gait Pattern/deviations: Step-through pattern;Decreased stride length;Wide base of support     General Gait Details: mild instability, but manages well with RW, kept on 3L O2 with ambulation as on O2 at rest  Stairs            Wheelchair Mobility    Modified Rankin (Stroke Patients Only)       Balance Overall balance assessment: Needs assistance   Sitting balance-Leahy Scale: Good     Standing balance support: No upper extremity supported Standing balance-Leahy Scale: Fair Standing balance comment: up to  stand without walker, used walker for hallway ambulation and to/from bathroom                             Pertinent Vitals/Pain Pain Assessment: Faces Faces Pain Scale: Hurts a little bit Pain Location: abdomen Pain Descriptors / Indicators: Guarding Pain Intervention(s): Monitored during session    Alex Torres expects to be discharged to:: Private residence Living Arrangements: Alone   Type of Home: Mobile home Home Access: Stairs to enter Entrance Stairs-Rails: Psychiatric nurse of Steps: 4 Home Layout: One level Home Equipment: Grab bars - tub/shower;Walker - 2 wheels;Cane - single point      Prior Function Level of Independence: Independent               Hand Dominance   Dominant Hand: Right    Extremity/Trunk Assessment   Upper Extremity Assessment Upper Extremity Assessment: RUE deficits/detail;LUE deficits/detail RUE Deficits / Details: N/T in hands and reports prior cervical surgery, was going to have another, but had another bout of pancreatitis so couldn't do the surgery; strength grossly 4/5 throughout only tested shoulders to 90 degrees RUE Sensation: decreased light touch LUE Deficits / Details: N/T in hands and reports prior cervical surgery, was going to have another, but had another bout of pancreatitis so couldn't do the surgery; strength grossly 4/5 throughout only tested shoulders to 90 degrees LUE Sensation: decreased light touch    Lower Extremity Assessment Lower Extremity Assessment: RLE deficits/detail;LLE deficits/detail RLE Deficits / Details: AROM WFL,  strength grossly 4/5, some hypersensitivity on feet LLE Deficits / Details: AROM WFL, strength grossly 4/5, some hypersensitivity on feet       Communication   Communication: No difficulties  Cognition Arousal/Alertness: Awake/alert Behavior During Therapy: WFL for tasks assessed/performed Overall Cognitive Status: Within Functional Limits for  tasks assessed                                        General Comments General comments (skin integrity, edema, etc.): toileted in bathroom; checked SpO2 on RA after ambulation and maintained 100%, kept on RA, RN aware    Exercises     Assessment/Plan    PT Assessment Patient needs continued PT services  PT Problem List Decreased strength;Decreased mobility;Decreased balance;Decreased knowledge of use of DME;Decreased activity tolerance       PT Treatment Interventions DME instruction;Therapeutic activities;Gait training;Therapeutic exercise;Patient/family education;Stair training;Functional mobility training    PT Goals (Current goals can be found in the Care Plan section)  Acute Rehab PT Goals Patient Stated Goal: to go home PT Goal Formulation: With patient Time For Goal Achievement: 10/22/19 Potential to Achieve Goals: Good    Frequency Min 3X/week   Barriers to discharge        Co-evaluation               AM-PAC PT "6 Clicks" Mobility  Outcome Measure Help needed turning from your back to your side while in a flat bed without using bedrails?: None Help needed moving from lying on your back to sitting on the side of a flat bed without using bedrails?: None Help needed moving to and from a bed to a chair (including a wheelchair)?: None Help needed standing up from a chair using your arms (e.g., wheelchair or bedside chair)?: None Help needed to walk in hospital room?: A Little Help needed climbing 3-5 steps with a railing? : A Little 6 Click Score: 22    End of Session Equipment Utilized During Treatment: Oxygen Activity Tolerance: Patient tolerated treatment well Patient left: in chair;with call bell/phone within reach;with chair alarm set Nurse Communication: Mobility status;Other (comment) (linen change for bed) PT Visit Diagnosis: Other abnormalities of gait and mobility (R26.89);Muscle weakness (generalized) (M62.81)    Time:  8144-8185 PT Time Calculation (min) (ACUTE ONLY): 38 min   Charges:   PT Evaluation $PT Eval Moderate Complexity: 1 Mod PT Treatments $Gait Training: 8-22 mins $Therapeutic Activity: 8-22 mins        Magda Kiel, PT Acute Rehabilitation Services UDJSH:[phone] Office:[phone] 10/08/2019   Reginia Naas 10/08/2019, 11:11 AM

## 2019-10-08 NOTE — Progress Notes (Signed)
Patient last ate or drank at 1230- [place] aware and state they will scan patient in 6 hours. Patient aware he is now NPO until the scan is completed.

## 2019-10-09 LAB — COMPREHENSIVE METABOLIC PANEL
ALT: 73 U/L — ABNORMAL HIGH (ref 0–44)
AST: 39 U/L (ref 15–41)
Albumin: 2.8 g/dL — ABNORMAL LOW (ref 3.5–5.0)
Alkaline Phosphatase: 70 U/L (ref 38–126)
Anion gap: 6 (ref 5–15)
BUN: <5 mg/dL — ABNORMAL LOW (ref 8–23)
CO2: 31 mmol/L (ref 22–32)
Calcium: 8.9 mg/dL (ref 8.9–10.3)
Chloride: 99 mmol/L (ref 98–111)
Creatinine, Ser: 0.76 mg/dL (ref 0.61–1.24)
GFR calc Af Amer: >60 mL/min (ref >60)
GFR calc non Af Amer: >60 mL/min (ref >60)
Glucose, Bld: 140 mg/dL — ABNORMAL HIGH (ref 70–99)
Potassium: 4.1 mmol/L (ref 3.5–5.1)
Sodium: 136 mmol/L (ref 135–145)
Total Bilirubin: 0.5 mg/dL (ref 0.3–1.2)
Total Protein: 6.5 g/dL (ref 6.5–8.1)

## 2019-10-09 LAB — HEPATITIS PANEL, ACUTE
HCV Ab: NONREACTIVE
Hep A IgM: NONREACTIVE
Hep B C IgM: NONREACTIVE
Hepatitis B Surface Ag: INVALID

## 2019-10-09 LAB — CBC
HCT: 42.7 % (ref 39.0–52.0)
Hemoglobin: 13.3 g/dL (ref 13.0–17.0)
MCH: 28.7 pg (ref 26.0–34.0)
MCHC: 31.1 g/dL (ref 30.0–36.0)
MCV: 92.2 fL (ref 80.0–100.0)
Platelets: 185 10*3/uL (ref 150–400)
RBC: 4.63 MIL/uL (ref 4.22–5.81)
RDW: 14 % (ref 11.5–15.5)
WBC: 8.4 10*3/uL (ref 4.0–10.5)
nRBC: 0 % (ref 0.0–0.2)

## 2019-10-09 MED ORDER — POLYETHYLENE GLYCOL 3350 17 G PO PACK: 17.0000 g | PACK | Freq: Every day | ORAL | 0 refills | Status: AC | PRN

## 2019-10-09 MED ORDER — TRAMADOL HCL 50 MG PO TABS: 50.0000 mg | ORAL_TABLET | Freq: Four times a day (QID) | ORAL | 0 refills | Status: AC | PRN

## 2019-10-09 MED ORDER — SENNOSIDES-DOCUSATE SODIUM 8.6-50 MG PO TABS: 2.0000 | ORAL_TABLET | Freq: Two times a day (BID) | ORAL | 0 refills | Status: AC

## 2019-10-09 NOTE — Progress Notes (Signed)
Physical Therapy Treatment Patient Details Name: Alex Torres MRN: [id number] DOB: 1955-10-16 Today's Date: 10/09/2019    History of Present Illness Alex Torres is a 64 y.o. male with medical history significant of alcoholism, hypertension, depression, who presented with abdominal pain nausea and vomiting. Patient admitted with alcoholic pancreatitis.    PT Comments    Pt standing in room on arrival.  Pt is pleasant but very flat throughout session.  Agreeable to PT services.  Focused on stair training to prepare for entry into his home.  He tolerated session well.  Today [birth year] his birthday and he is eager to return home.     Follow Up Recommendations  Home health PT (pt likley to decline)     Equipment Recommendations       Recommendations for Other Services       Precautions / Restrictions Precautions Precautions: Fall Restrictions Weight Bearing Restrictions: No    Mobility  Bed Mobility Overal bed mobility: Modified Independent                Transfers Overall transfer level: Needs assistance   Transfers: Sit to/from Stand Sit to Stand: Supervision         General transfer comment: assist for lines/safety  Ambulation/Gait Ambulation/Gait assistance: Supervision Gait Distance (Feet): 200 Feet Assistive device: None Gait Pattern/deviations: Trunk flexed;Shuffle;Step-through pattern     General Gait Details: Slow cadence with poor foot clearacne and flexed posture.  Required cues for increasing step height/length and improving posture.  Mild imbalance on incline but able to recover.   Stairs Stairs: Yes Stairs assistance: Supervision Stair Management: One rail Right Number of Stairs: 4 General stair comments: Mild unsteadiness but no true LOB.   Wheelchair Mobility    Modified Rankin (Stroke Patients Only)       Balance Overall balance assessment: Needs assistance   Sitting balance-Leahy Scale: Good       Standing balance-Leahy  Scale: Fair                              Cognition Arousal/Alertness: Awake/alert Behavior During Therapy: Flat affect Overall Cognitive Status: Within Functional Limits for tasks assessed                                        Exercises      General Comments        Pertinent Vitals/Pain Pain Assessment: No/denies pain    Home Living                      Prior Function            PT Goals (current goals can now be found in the care plan section) Acute Rehab PT Goals Patient Stated Goal: to go home Potential to Achieve Goals: Good Progress towards PT goals: Progressing toward goals    Frequency    Min 3X/week      PT Plan Current plan remains appropriate    Co-evaluation              AM-PAC PT "6 Clicks" Mobility   Outcome Measure  Help needed turning from your back to your side while in a flat bed without using bedrails?: None Help needed moving from lying on your back to sitting on the side of a flat bed without using bedrails?: None  Help needed moving to and from a bed to a chair (including a wheelchair)?: None Help needed standing up from a chair using your arms (e.g., wheelchair or bedside chair)?: None Help needed to walk in hospital room?: None Help needed climbing 3-5 steps with a railing? : None 6 Click Score: 24    End of Session Equipment Utilized During Treatment: Gait belt Activity Tolerance: Patient tolerated treatment well Patient left: with call bell/phone within reach (left standing in room.)   PT Visit Diagnosis: Other abnormalities of gait and mobility (R26.89);Muscle weakness (generalized) (M62.81)     Time: 2258-3462 PT Time Calculation (min) (ACUTE ONLY): 9 min  Charges:  $Gait Training: 8-22 mins                     Alex Torres , PTA Acute Rehabilitation Services Pager [phone] Office [phone]     Alex Torres Eli Hose 10/09/2019, 11:42 AM

## 2019-10-09 NOTE — Care Management Important Message (Signed)
Important Message  Patient Details  Name: Alex Torres MRN: [id number] Date of Birth: [birth year]   Medicare Important Message Given:  Yes     Orbie Pyo 10/09/2019, 12:44 PM

## 2019-10-09 NOTE — TOC Transition Note (Signed)
Transition of Care [facility]) - CM/SW Discharge Note   Patient Details  Name: KOU GUCCIARDO MRN: [id number] Date of Birth: 01-Nov-1955  [facility] CM/SW Contact:  Alexander Mt, LCSW Phone Number:  10/09/2019, 12:13 PM   Clinical Narrative:    CSW spoke with pt at bedside. Pt states he is ready to go home. He denies anyone being able to take him home at this time even after CSW inquired about son listed. Agreeable to [facility]. CSW sent signed consent form to [email or web address]. Pt not interested in cessation resources for ETOH at this time. Pt declines any HH PT after speaking with RNCM Heather on 8/18.   RN Mendel Ryder to assist pt with meeting schedule Transportation downstairs.    Final next level of care: Home/Self Care Barriers to Discharge: Barriers Resolved   Patient Goals and CMS Choice Patient states their goals for this hospitalization and ongoing recovery are:: to return to home CMS Medicare.gov Compare Post Acute Care list provided to:: Patient Choice offered to / list presented to : Patient  Discharge Placement Name of family member notified: pt responsible for self Patient and family notified of of transfer: 10/09/19  Discharge Plan and Services Discharge Planning Services: CM Consult Post Acute Care Choice: Home Health          DME Arranged: N/A DME Agency: NA  HH Arranged: Patient Refused [facility]   Readmission Risk Interventions No flowsheet data found.

## 2019-10-09 NOTE — Discharge Instructions (Signed)
Acute Pancreatitis  Acute pancreatitis happens when the pancreas gets swollen. The pancreas is a large gland in the body that helps to control blood sugar. It also makes enzymes that help to digest food. This condition can last a few days and cause serious problems. The lungs, heart, and kidneys may stop working. What are the causes? Causes include:  Alcohol abuse.  Drug abuse.  Gallstones.  A tumor in the pancreas. Other causes include:  Some medicines.  Some chemicals.  Diabetes.  An infection.  Damage caused by an accident.  The poison (venom) from a scorpion bite.  Belly (abdominal) surgery.  The body's defense system (immune system) attacking the pancreas (autoimmune pancreatitis).  Genes that are passed from parent to child (inherited). In some cases, the cause is not known. What are the signs or symptoms?  Pain in the upper belly that may be felt in the back. The pain may be very bad.  Swelling of the belly.  Feeling sick to your stomach (nauseous) and throwing up (vomiting).  Fever. How is this treated? You will likely have to stay in the hospital. Treatment may include:  Pain medicine.  Fluid through an IV tube.  Placing a tube in the stomach to take out the stomach contents. This may help you stop throwing up.  Not eating for 3-4 days.  Antibiotic medicines, if you have an infection.  Treating any other problems that may be the cause.  Steroid medicines, if your problem is caused by your defense system attacking your body's own tissues.  Surgery. Follow these instructions at home: Eating and drinking   Follow instructions from your doctor about what to eat and drink.  Eat foods that do not have a lot of fat in them.  Eat small meals often. Do not eat big meals.  Drink enough fluid to keep your pee (urine) pale yellow.  Do not drink alcohol if it caused your condition. Medicines  Take over-the-counter and prescription medicines only  as told by your doctor.  Ask your doctor if the medicine prescribed to you: ? Requires you to avoid driving or using heavy machinery. ? Can cause trouble pooping (constipation). You may need to take steps to prevent or treat trouble pooping:  Take over-the-counter or prescription medicines.  Eat foods that are high in fiber. These include beans, whole grains, and fresh fruits and vegetables.  Limit foods that are high in fat and sugar. These include fried or sweet foods. General instructions  Do not use any products that contain nicotine or tobacco, such as cigarettes, e-cigarettes, and chewing tobacco. If you need help quitting, ask your doctor.  Get plenty of rest.  Check your blood sugar at home as told by your doctor.  Keep all follow-up visits as told by your doctor. This is important. Contact a doctor if:  You do not get better as quickly as expected.  You have new symptoms.  Your symptoms get worse.  You have pain or weakness that lasts a long time.  You keep feeling sick to your stomach.  You get better and then you have pain again.  You have a fever. Get help right away if:  You cannot eat or keep fluids down.  Your pain gets very bad.  Your skin or the white part of your eyes turns yellow.  You have sudden swelling in your belly.  You throw up.  You feel dizzy or you pass out (faint).  Your blood sugar is high (over 300  mg/dL). Summary  Acute pancreatitis happens when the pancreas gets swollen.  This condition is often caused by alcohol abuse, drug abuse, or gallstones.  You will likely have to stay in the hospital for treatment. This information is not intended to replace advice given to you by your health care provider. Make sure you discuss any questions you have with your health care provider. Document Revised: 11/26/2017 Document Reviewed: 11/26/2017 Elsevier Patient Education  [place].   Alcohol Abuse and Nutrition Alcohol  abuse is any pattern of alcohol consumption that harms your health, relationships, or work. Alcohol abuse can cause poor nutrition (malnutrition or malnourishment) and a lack of nutrients (nutrient deficiencies), which can lead to more complications. Alcohol abuse brings malnutrition and nutrient deficiencies in two ways:  It causes your liver to work abnormally. This affects how your body divides (breaks down) and absorbs nutrients from food.  It causes you to eat poorly. Many people who abuse alcohol do not eat enough carbohydrates, protein, fat, vitamins, and minerals. Nutrients that are commonly lacking (deficient) in people who abuse alcohol include:  Vitamins. ? Vitamin A. This is needed for your vision, metabolism, and ability to fight off infections (immunity). ? B vitamins. These include folate, thiamine, and niacin. These are needed for new cell growth. ? Vitamin C. This plays an important role in wound healing, immunity, and helping your body to absorb iron. ? Vitamin D. This is necessary for your body to absorb and use calcium. It is produced by your liver, but you can also get it from food and from sun exposure.  Minerals. ? Calcium. This is needed for healthy bones as well as heart and blood vessel (cardiovascular) function. ? Iron. This is important for blood, muscle, and nervous system functioning. ? Magnesium. This plays an important role in muscle and nerve function, and it helps to control blood sugar and blood pressure. ? Zinc. This is important for the normal functioning of your nervous system and digestive system (gastrointestinal tract). If you think that you have an alcohol dependency problem, or if it is hard to stop drinking because you feel sick or different when you do not use alcohol, talk with your health care provider or another health professional about where to get help. Nutrition is an essential factor in therapy for alcohol abuse. Your health care provider or  diet and nutrition specialist (dietitian) will work with you to design a plan that can help to restore nutrients to your body and prevent the risk of complications. What is my plan? Your dietitian may develop a specific eating plan that is based on your condition and any other problems that you have. An eating plan will commonly include:  A balanced diet. ? Grains: 6-8 oz (170-227 g) a day. Examples of 1 oz of whole grains include 1 cup of whole-wheat cereal,  cup of brown rice, or 1 slice of whole-wheat bread. ? Vegetables: 2-3 cups a day. Examples of 1 cup of vegetables include 2 medium carrots, 1 large tomato, or 2 stalks of celery. ? Fruits: 1-2 cups a day. Examples of 1 cup of fruit include 1 large banana, 1 small apple, 8 large strawberries, or 1 large orange. ? Meat and other protein: 5-6 oz (142-170 g) a day.  A cut of meat or fish that is the size of a deck of cards is about 3-4 oz.  Foods that provide 1 oz of protein include 1 egg,  cup of nuts or seeds, or 1  tablespoon (16 g) of peanut butter. ? Dairy: 2-3 cups a day. Examples of 1 cup of dairy include 8 oz (230 mL) of milk, 8 oz (230 g) of yogurt, or 1 oz (44 g) of natural cheese.  Vitamin and mineral supplements. What are tips for following this plan?  Eat frequent meals and snacks. Try to eat 5-6 small meals each day.  Take vitamin or mineral supplements as recommended by your dietitian.  If you are malnourished or if your dietitian recommends it: ? You may follow a high-protein, high-calorie diet. This may include:  2,000-3,000 calories (kilocalories) a day.  70-100 g (grams) of protein a day. ? You may be directed to follow a diet that includes a complete nutritional supplement beverage. This can help to restore calories, protein, and vitamins to your body. Depending on your condition, you may be advised to consume this beverage instead of your meals or in addition to them.  Certain medicines may cause changes in your  appetite, taste, and weight. Work with your health care provider and dietitian to make any changes to your medicines and eating plan.  If you are unable to take in enough food and calories by mouth, your health care provider may recommend a feeding tube. This tube delivers nutritional supplements directly to your stomach. Recommended foods  Eat foods that are high in molecules that prevent oxygen from reacting with your food (antioxidants). These foods include grapes, berries, nuts, green tea, and dark green or orange vegetables. Eating these can help to prevent some of the stress that is placed on your liver by consuming alcohol.  Eat a variety of fresh fruits and vegetables each day. This will help you to get fiber and vitamins in your diet.  Drink plenty of water and other clear fluids, such as apple juice and broth. Try to drink at least 48-64 oz (1.5-2 L) of water a day.  Include foods fortified with vitamins and minerals in your diet. Commonly fortified foods include milk, orange juice, cereal, and bread.  Eat a variety of foods that are high in omega-3 and omega-6 fatty acids. These include fish, nuts and seeds, and soybeans. These foods may help your liver to recover and may also stabilize your mood.  If you are a vegetarian: ? Eat a variety of protein-rich foods. ? Pair whole grains with plant-based proteins at meals and snack time. For example, eat rice with beans, put peanut butter on whole-grain toast, or eat oatmeal with sunflower seeds. The items listed above may not be a complete list of foods and beverages you can eat. Contact a dietitian for more information. Foods to avoid  Avoid foods and drinks that are high in fat and sugar. Sugary drinks, salty snacks, and candy contain empty calories. This means that they lack important nutrients such as protein, fiber, and vitamins.  Avoid alcohol. This is the best way to avoid malnutrition due to alcohol abuse. If you must drink, drink  measured amounts. Measured drinking means limiting your intake to no more than 1 drink a day for nonpregnant women and 2 drinks a day for men. One drink equals 12 oz (355 mL) of beer, 5 oz (148 mL) of wine, or 1 oz (44 mL) of hard liquor.  Limit your intake of caffeine. Replace drinks like coffee and black tea with decaffeinated coffee and decaffeinated herbal tea. The items listed above may not be a complete list of foods and beverages you should avoid. Contact a dietitian for more  information. Summary  Alcohol abuse can cause poor nutrition (malnutrition or malnourishment) and a lack of nutrients (nutrient deficiencies), which can lead to more health problems.  Common nutrient deficiencies include vitamin deficiencies (A, B, C, and D) and mineral deficiencies (calcium, iron, magnesium, and zinc).  Nutrition is an essential factor in therapy for alcohol abuse.  Your health care provider and dietitian can help you to develop a specific eating plan that includes a balanced diet plus vitamin and mineral supplements. This information is not intended to replace advice given to you by your health care provider. Make sure you discuss any questions you have with your health care provider. Document Revised: 05/28/2018 Document Reviewed: 10/24/2016 Elsevier Patient Education  [place].   Alcohol Abuse and Nutrition Alcohol abuse is any pattern of alcohol consumption that harms your health, relationships, or work. Alcohol abuse can cause poor nutrition (malnutrition or malnourishment) and a lack of nutrients (nutrient deficiencies), which can lead to more complications. Alcohol abuse brings malnutrition and nutrient deficiencies in two ways:  It causes your liver to work abnormally. This affects how your body divides (breaks down) and absorbs nutrients from food.  It causes you to eat poorly. Many people who abuse alcohol do not eat enough carbohydrates, protein, fat, vitamins, and  minerals. Nutrients that are commonly lacking (deficient) in people who abuse alcohol include:  Vitamins. ? Vitamin A. This is needed for your vision, metabolism, and ability to fight off infections (immunity). ? B vitamins. These include folate, thiamine, and niacin. These are needed for new cell growth. ? Vitamin C. This plays an important role in wound healing, immunity, and helping your body to absorb iron. ? Vitamin D. This is necessary for your body to absorb and use calcium. It is produced by your liver, but you can also get it from food and from sun exposure.  Minerals. ? Calcium. This is needed for healthy bones as well as heart and blood vessel (cardiovascular) function. ? Iron. This is important for blood, muscle, and nervous system functioning. ? Magnesium. This plays an important role in muscle and nerve function, and it helps to control blood sugar and blood pressure. ? Zinc. This is important for the normal functioning of your nervous system and digestive system (gastrointestinal tract). If you think that you have an alcohol dependency problem, or if it is hard to stop drinking because you feel sick or different when you do not use alcohol, talk with your health care provider or another health professional about where to get help. Nutrition is an essential factor in therapy for alcohol abuse. Your health care provider or diet and nutrition specialist (dietitian) will work with you to design a plan that can help to restore nutrients to your body and prevent the risk of complications. What is my plan? Your dietitian may develop a specific eating plan that is based on your condition and any other problems that you have. An eating plan will commonly include:  A balanced diet. ? Grains: 6-8 oz (170-227 g) a day. Examples of 1 oz of whole grains include 1 cup of whole-wheat cereal,  cup of brown rice, or 1 slice of whole-wheat bread. ? Vegetables: 2-3 cups a day. Examples of 1 cup of  vegetables include 2 medium carrots, 1 large tomato, or 2 stalks of celery. ? Fruits: 1-2 cups a day. Examples of 1 cup of fruit include 1 large banana, 1 small apple, 8 large strawberries, or 1 large orange. ? Meat and  other protein: 5-6 oz (142-170 g) a day.  A cut of meat or fish that is the size of a deck of cards is about 3-4 oz.  Foods that provide 1 oz of protein include 1 egg,  cup of nuts or seeds, or 1 tablespoon (16 g) of peanut butter. ? Dairy: 2-3 cups a day. Examples of 1 cup of dairy include 8 oz (230 mL) of milk, 8 oz (230 g) of yogurt, or 1 oz (44 g) of natural cheese.  Vitamin and mineral supplements. What are tips for following this plan?  Eat frequent meals and snacks. Try to eat 5-6 small meals each day.  Take vitamin or mineral supplements as recommended by your dietitian.  If you are malnourished or if your dietitian recommends it: ? You may follow a high-protein, high-calorie diet. This may include:  2,000-3,000 calories (kilocalories) a day.  70-100 g (grams) of protein a day. ? You may be directed to follow a diet that includes a complete nutritional supplement beverage. This can help to restore calories, protein, and vitamins to your body. Depending on your condition, you may be advised to consume this beverage instead of your meals or in addition to them.  Certain medicines may cause changes in your appetite, taste, and weight. Work with your health care provider and dietitian to make any changes to your medicines and eating plan.  If you are unable to take in enough food and calories by mouth, your health care provider may recommend a feeding tube. This tube delivers nutritional supplements directly to your stomach. Recommended foods  Eat foods that are high in molecules that prevent oxygen from reacting with your food (antioxidants). These foods include grapes, berries, nuts, green tea, and dark green or orange vegetables. Eating these can help to prevent  some of the stress that is placed on your liver by consuming alcohol.  Eat a variety of fresh fruits and vegetables each day. This will help you to get fiber and vitamins in your diet.  Drink plenty of water and other clear fluids, such as apple juice and broth. Try to drink at least 48-64 oz (1.5-2 L) of water a day.  Include foods fortified with vitamins and minerals in your diet. Commonly fortified foods include milk, orange juice, cereal, and bread.  Eat a variety of foods that are high in omega-3 and omega-6 fatty acids. These include fish, nuts and seeds, and soybeans. These foods may help your liver to recover and may also stabilize your mood.  If you are a vegetarian: ? Eat a variety of protein-rich foods. ? Pair whole grains with plant-based proteins at meals and snack time. For example, eat rice with beans, put peanut butter on whole-grain toast, or eat oatmeal with sunflower seeds. The items listed above may not be a complete list of foods and beverages you can eat. Contact a dietitian for more information. Foods to avoid  Avoid foods and drinks that are high in fat and sugar. Sugary drinks, salty snacks, and candy contain empty calories. This means that they lack important nutrients such as protein, fiber, and vitamins.  Avoid alcohol. This is the best way to avoid malnutrition due to alcohol abuse. If you must drink, drink measured amounts. Measured drinking means limiting your intake to no more than 1 drink a day for nonpregnant women and 2 drinks a day for men. One drink equals 12 oz (355 mL) of beer, 5 oz (148 mL) of wine, or 1 oz (44  mL) of hard liquor.  Limit your intake of caffeine. Replace drinks like coffee and black tea with decaffeinated coffee and decaffeinated herbal tea. The items listed above may not be a complete list of foods and beverages you should avoid. Contact a dietitian for more information. Summary  Alcohol abuse can cause poor nutrition (malnutrition or  malnourishment) and a lack of nutrients (nutrient deficiencies), which can lead to more health problems.  Common nutrient deficiencies include vitamin deficiencies (A, B, C, and D) and mineral deficiencies (calcium, iron, magnesium, and zinc).  Nutrition is an essential factor in therapy for alcohol abuse.  Your health care provider and dietitian can help you to develop a specific eating plan that includes a balanced diet plus vitamin and mineral supplements. This information is not intended to replace advice given to you by your health care provider. Make sure you discuss any questions you have with your health care provider. Document Revised: 05/28/2018 Document Reviewed: 10/24/2016 Elsevier Patient Education  [place].

## 2019-10-09 NOTE — Discharge Summary (Addendum)
[name] VQX:[id number] DOB: 12-25-1955 DOA: 10/06/2019  PCP: Leonard Downing, MD  Admit date: 10/06/2019 Discharge date: 10/09/2019  Admitted From: Home Disposition: Home  Recommendations for Outpatient Follow-up:  1. Follow up with PCP in 1-2 weeks 2. Please obtain CMP (check LFTs) 3. Provided short prescription of  12 tab of tramadol provided for pain control ( reviewed pain registry)   Home Health: PT recommended but patient declined Equipment/Devices: None  Discharge Condition: Stable CODE STATUS: Full Diet recommendation:  Low fat  Brief/Interim Summary: History of present illness:  Alex Torres is a 64 y.o. year old male with medical history significant for chronic alcoholism, HTN who presented on 10/06/2019 with worsening epigastric abdominal pain, vomiting and was found to have alcohol induced pancreatitis with lipase of 324 and CT abdomen consistent with acute pancreatitis with no associated cyst, and associated inflammatory changes throughout the duodenum..Remaining hospital course addressed in problem based format below:   Hospital Course:   Acute pancreatitis, suspect alcohol induced, resolved Admits to being chronic heavy drinker with prior history of pancreatitis.    Improved with IV fluids and IV pain medicine, right upper quadrant ultrasound showed no gallbladder/liver pathology.  Patient improved with pain control and was able to tolerate regular diet prior to discharge. -Advise cessation of alcohol abuse -Provided short prescription of tramadol on discharge for breakthrough pain --(of note oxycodone listed as allergy, however patient tolerated IV Dilaudid and oxycodone in hospital, patient did have PEA arrest 2 years ago in the setting of OSA/OHS/CPAP use complicated by sedation from opioids at that time)  Chronic alcohol abuse.  Acknowledges that he needs to stop drinking.    Was monitored on CIWA protocol with scheduled use of Ativan followed by  titration to as needed Ativan.  Patient states he wants to stop drinking. -Outpatient resources recommended by [facility] but patient declined -Continue folic acid, thiamine, multivitamin   Elevated LFTs, improving both AST and ALT were greater than 100 during hospitalization, on discharge AST within normal limits, ALT 73..  No abnormalities in alk phos or total bili.    Right upper quadrant ultrasound showed no gallbladder etiology or abnormalities, only showed hepatic steatosis.  Hepatitis panel within normal limits -Recommend repeat CMP on PCP follow-up  Leukocytosis, resolved.  Likely stress leukocytosis related to acute pancreatitis.  Hypertension -Resume home ARB -Advise dclose follow-up with PCP   Consultations:  None  Procedures/Studies: Right upper quadrant ultrasound 8/18: cholelithiasis without acute cholecystitis, fatty liver Subjective: Abdominal pain 2 out of 10.  Tolerating regular diet.  Ready to go home. Discharge Exam: Vitals:   10/08/19 2118 10/09/19 0410  BP: (!) 144/90 (!) 148/101  Pulse: (!) 106 67  Resp: 18 17  Temp: 98 F (36.7 C) 98.1 F (36.7 C)  SpO2: 97% 100%   Vitals:   10/08/19 0500 10/08/19 1332 10/08/19 2118 10/09/19 0410  BP: 120/86 (!) 162/90 (!) 144/90 (!) 148/101  Pulse: 89 88 (!) 106 67  Resp: '19 16 18 17  ' Temp: 97.6 F (36.4 C) 98.1 F (36.7 C) 98 F (36.7 C) 98.1 F (36.7 C)  TempSrc: Oral Oral Oral Oral  SpO2: 97% 98% 97% 100%  Weight:      Height:        General: Lying in bed, no apparent distress Eyes: EOMI, anicteric ENT: Oral Mucosa clear and moist Cardiovascular: regular rate and rhythm, no murmurs, rubs or gallops, no edema, Respiratory: Normal respiratory effort on room air, lungs clear to auscultation bilaterally  Abdomen: soft, non-distended, non-tender, normal bowel sounds Skin: No Rash Neurologic: Grossly no focal neuro deficit.Mental status AAOx3, speech normal, Psychiatric:Appropriate affect, and mood  Discharge  Diagnoses:  Principal Problem:   Acute pancreatitis Active Problems:   Essential hypertension, benign   Alcoholism ([place])   Pancreatitis, acute   Leukocytosis   Elevated LFTs    Discharge Instructions   Allergies as of 10/09/2019      Reactions   Oxycodone Other (See Comments)   PEA arrest, low opioid tolerance & CCM advises to avoid in future      Medication List    STOP taking these medications   acetaminophen 325 MG tablet Commonly known as: TYLENOL   amLODipine 10 MG tablet Commonly known as: NORVASC   folic acid 1 MG tablet Commonly known as: FOLVITE   ibuprofen 600 MG tablet Commonly known as: ADVIL   ipratropium-albuterol 0.5-2.5 (3) MG/3ML Soln Commonly known as: DUONEB   metoprolol succinate 25 MG 24 hr tablet Commonly known as: TOPROL-XL   multivitamin with minerals Tabs tablet   thiamine 100 MG tablet   traZODone 50 MG tablet Commonly known as: DESYREL     TAKE these medications   lisinopril 20 MG tablet Commonly known as: ZESTRIL Take 20 mg by mouth daily.   polyethylene glycol 17 g packet Commonly known as: MIRALAX / GLYCOLAX Take 17 g by mouth daily as needed.   senna-docusate 8.6-50 MG tablet Commonly known as: Senokot-S Take 2 tablets by mouth 2 (two) times daily.   traMADol 50 MG tablet Commonly known as: ULTRAM Take 1 tablet (50 mg total) by mouth every 6 (six) hours as needed for up to 3 days for moderate pain.       Allergies  Allergen Reactions  . Oxycodone Other (See Comments)    PEA arrest, low opioid tolerance & CCM advises to avoid in future        The results of significant diagnostics from this hospitalization (including imaging, microbiology, ancillary and laboratory) are listed below for reference.     Microbiology: Recent Results (from the past 240 hour(s))  SARS Coronavirus 2 by RT PCR (hospital order, performed in [facility] hospital lab) Nasopharyngeal Nasopharyngeal Swab     Status: None    Collection Time: 10/06/19  4:12 PM   Specimen: Nasopharyngeal Swab  Result Value Ref Range Status   SARS Coronavirus 2 NEGATIVE NEGATIVE Final    Comment: (NOTE) SARS-CoV-2 target nucleic acids are NOT DETECTED.  The SARS-CoV-2 RNA is generally detectable in upper and lower respiratory specimens during the acute phase of infection. The lowest concentration of SARS-CoV-2 viral copies this assay can detect is 250 copies / mL. A negative result does not preclude SARS-CoV-2 infection and should not be used as the sole basis for treatment or other patient management decisions.  A negative result may occur with improper specimen collection / handling, submission of specimen other than nasopharyngeal swab, presence of viral mutation(s) within the areas targeted by this assay, and inadequate number of viral copies (<250 copies / mL). A negative result must be combined with clinical observations, patient history, and epidemiological information.  Fact Sheet for Patients:   StrictlyIdeas.no  Fact Sheet for Healthcare Providers: BankingDealers.co.za  This test is not yet approved or  cleared by the [place] FDA and has been authorized for detection and/or diagnosis of SARS-CoV-2 by FDA under an Emergency Use Authorization (EUA).  This EUA will remain in effect (meaning this test can be used) for the  duration of the COVID-19 declaration under Section 564(b)(1) of the Act, 21 U.S.C. section 360bbb-3(b)(1), unless the authorization is terminated or revoked sooner.  Performed at [facility], [place] [address] [place], [place] [address]      Labs: BNP (last 3 results) No results for input(s): BNP in the last 8760 hours. Basic Metabolic Panel: Recent Labs  Lab 10/06/19 0448 10/07/19 0103 10/07/19 0320 10/09/19 0357  NA 131* 136 135 136  K 4.7 3.9 4.6 4.1  CL 99 102 103 99  CO2 21* '26 26 31  ' GLUCOSE 136* 127* 192* 140*  BUN 6*  <5* 5* <5*  CREATININE 0.90 0.81 0.80 0.76  CALCIUM 9.6 9.1 8.9 8.9  MG  --  1.7  --   --   PHOS  --  2.7  --   --    Liver Function Tests: Recent Labs  Lab 10/06/19 0448 10/07/19 0103 10/07/19 0320 10/09/19 0357  AST 190* 142* 146* 39  ALT 113* 129* 133* 73*  ALKPHOS 99 91 88 70  BILITOT 1.9* 1.3* 1.0 0.5  PROT 7.2 7.0 7.2 6.5  ALBUMIN 3.5 3.2* 3.3* 2.8*   Recent Labs  Lab 10/06/19 0448 10/07/19 0320  LIPASE 324* 80*   No results for input(s): AMMONIA in the last 168 hours. CBC: Recent Labs  Lab 10/06/19 0448 10/07/19 0103 10/07/19 0320 10/09/19 0357  WBC 9.2 13.4* 15.2* 8.4  NEUTROABS 6.7  --   --   --   HGB 15.4 14.7 14.3 13.3  HCT 47.0 44.6 45.1 42.7  MCV 89.2 90.1 90.9 92.2  PLT PLATELET CLUMPS NOTED ON SMEAR, UNABLE TO ESTIMATE 205 194 185   Cardiac Enzymes: No results for input(s): CKTOTAL, CKMB, CKMBINDEX, TROPONINI in the last 168 hours. BNP: Invalid input(s): POCBNP CBG: No results for input(s): GLUCAP in the last 168 hours. D-Dimer No results for input(s): DDIMER in the last 72 hours. Hgb A1c No results for input(s): HGBA1C in the last 72 hours. Lipid Profile No results for input(s): CHOL, HDL, LDLCALC, TRIG, CHOLHDL, LDLDIRECT in the last 72 hours. Thyroid function studies No results for input(s): TSH, T4TOTAL, T3FREE, THYROIDAB in the last 72 hours.  Invalid input(s): FREET3 Anemia work up No results for input(s): VITAMINB12, FOLATE, FERRITIN, TIBC, IRON, RETICCTPCT in the last 72 hours. Urinalysis    Component Value Date/Time   COLORURINE YELLOW 09/16/2017 1234   APPEARANCEUR CLOUDY (A) 09/16/2017 1234   LABSPEC 1.020 09/16/2017 1234   PHURINE 6.0 09/16/2017 1234   GLUCOSEU NEGATIVE 09/16/2017 1234   HGBUR TRACE (A) 09/16/2017 1234   BILIRUBINUR SMALL (A) 09/16/2017 1234   KETONESUR 15 (A) 09/16/2017 1234   PROTEINUR 30 (A) 09/16/2017 1234   NITRITE NEGATIVE 09/16/2017 1234   LEUKOCYTESUR NEGATIVE 09/16/2017 1234   Sepsis  Labs Invalid input(s): PROCALCITONIN,  WBC,  LACTICIDVEN Microbiology Recent Results (from the past 240 hour(s))  SARS Coronavirus 2 by RT PCR (hospital order, performed in [place] hospital lab) Nasopharyngeal Nasopharyngeal Swab     Status: None   Collection Time: 10/06/19  4:12 PM   Specimen: Nasopharyngeal Swab  Result Value Ref Range Status   SARS Coronavirus 2 NEGATIVE NEGATIVE Final    Comment: (NOTE) SARS-CoV-2 target nucleic acids are NOT DETECTED.  The SARS-CoV-2 RNA is generally detectable in upper and lower respiratory specimens during the acute phase of infection. The lowest concentration of SARS-CoV-2 viral copies this assay can detect is 250 copies / mL. A negative result does not preclude SARS-CoV-2 infection  and should not be used as the sole basis for treatment or other patient management decisions.  A negative result may occur with improper specimen collection / handling, submission of specimen other than nasopharyngeal swab, presence of viral mutation(s) within the areas targeted by this assay, and inadequate number of viral copies (<250 copies / mL). A negative result must be combined with clinical observations, patient history, and epidemiological information.  Fact Sheet for Patients:   StrictlyIdeas.no  Fact Sheet for Healthcare Providers: BankingDealers.co.za  This test is not yet approved or  cleared by the [place] FDA and has been authorized for detection and/or diagnosis of SARS-CoV-2 by FDA under an Emergency Use Authorization (EUA).  This EUA will remain in effect (meaning this test can be used) for the duration of the COVID-19 declaration under Section 564(b)(1) of the Act, 21 U.S.C. section 360bbb-3(b)(1), unless the authorization is terminated or revoked sooner.  Performed at [facility], [place] [address], [place], [place] [address]      Time coordinating discharge: Over 30  minutes  SIGNED:   Desiree Hane, MD  [facility] 10/09/2019, 10:18 AM Pager   If 7PM-7AM, please contact night-coverage [email or web address] Password [email or web address]

## 2019-10-09 NOTE — Progress Notes (Signed)
Patient discharged to home. Verbalizes understanding of all discharge instructions. All questions Answered at this time. Patient awaiting to see the social worker for transportation home.

## 2024-02-27 ENCOUNTER — Emergency Department (HOSPITAL_COMMUNITY)

## 2024-02-27 ENCOUNTER — Encounter (HOSPITAL_COMMUNITY): Payer: Self-pay

## 2024-02-27 ENCOUNTER — Emergency Department (HOSPITAL_COMMUNITY)
Admission: EM | Admit: 2024-02-27 | Discharge: 2024-02-27 | Disposition: A | Discharge status: Home / Self Care | Attending: Emergency Medicine | Admitting: Emergency Medicine

## 2024-02-27 DIAGNOSIS — R0981 Nasal congestion: Secondary | ICD-10-CM | POA: Diagnosis not present

## 2024-02-27 DIAGNOSIS — K2980 Duodenitis without bleeding: Secondary | ICD-10-CM | POA: Diagnosis not present

## 2024-02-27 DIAGNOSIS — R079 Chest pain, unspecified: Secondary | ICD-10-CM | POA: Diagnosis not present

## 2024-02-27 DIAGNOSIS — R109 Unspecified abdominal pain: Secondary | ICD-10-CM | POA: Diagnosis present

## 2024-02-27 DIAGNOSIS — R000 Tachycardia, unspecified: Secondary | ICD-10-CM | POA: Diagnosis not present

## 2024-02-27 LAB — HEPATIC FUNCTION PANEL
ALT: 27 U/L (ref 0–44)
AST: 41 U/L (ref 15–41)
Albumin: 4 g/dL (ref 3.5–5.0)
Alkaline Phosphatase: 86 U/L (ref 38–126)
Bilirubin, Direct: 0.1 mg/dL (ref 0.0–0.2)
Indirect Bilirubin: 0.3 mg/dL (ref 0.3–0.9)
Total Bilirubin: 0.4 mg/dL (ref 0.0–1.2)
Total Protein: 8 g/dL (ref 6.5–8.1)

## 2024-02-27 LAB — BASIC METABOLIC PANEL WITH GFR
Anion gap: 16 — ABNORMAL HIGH (ref 5–15)
BUN: 6 mg/dL — ABNORMAL LOW (ref 8–23)
CO2: 19 mmol/L — ABNORMAL LOW (ref 22–32)
Calcium: 9.3 mg/dL (ref 8.9–10.3)
Chloride: 96 mmol/L — ABNORMAL LOW (ref 98–111)
Creatinine, Ser: 0.66 mg/dL (ref 0.61–1.24)
GFR, Estimated: >60 mL/min
Glucose, Bld: 151 mg/dL — ABNORMAL HIGH (ref 70–99)
Potassium: 3.6 mmol/L (ref 3.5–5.1)
Sodium: 131 mmol/L — ABNORMAL LOW (ref 135–145)

## 2024-02-27 LAB — CBC
HCT: 47.2 % (ref 39.0–52.0)
Hemoglobin: 16.2 g/dL (ref 13.0–17.0)
MCH: 31.2 pg (ref 26.0–34.0)
MCHC: 34.3 g/dL (ref 30.0–36.0)
MCV: 90.8 fL (ref 80.0–100.0)
Platelets: 229 K/uL (ref 150–400)
RBC: 5.2 MIL/uL (ref 4.22–5.81)
RDW: 17.9 % — ABNORMAL HIGH (ref 11.5–15.5)
WBC: 7.9 K/uL (ref 4.0–10.5)
nRBC: 0 % (ref 0.0–0.2)

## 2024-02-27 LAB — URINALYSIS, ROUTINE W REFLEX MICROSCOPIC
Bilirubin Urine: NEGATIVE
Glucose, UA: NEGATIVE mg/dL
Hgb urine dipstick: NEGATIVE
Ketones, ur: 20 mg/dL — AB
Leukocytes,Ua: NEGATIVE
Nitrite: NEGATIVE
Protein, ur: NEGATIVE mg/dL
Specific Gravity, Urine: 1.06 — ABNORMAL HIGH (ref 1.005–1.030)
pH: 5 (ref 5.0–8.0)

## 2024-02-27 LAB — TROPONIN T, HIGH SENSITIVITY
Troponin T High Sensitivity: 15 ng/L (ref 0–19)
Troponin T High Sensitivity: 17 ng/L (ref 0–19)

## 2024-02-27 LAB — LIPASE, BLOOD: Lipase: 24 U/L (ref 11–51)

## 2024-02-27 MED ORDER — PANTOPRAZOLE SODIUM 40 MG PO TBEC: 40.0000 mg | DELAYED_RELEASE_TABLET | Freq: Two times a day (BID) | ORAL | 0 refills | Status: AC

## 2024-02-27 MED ORDER — IOHEXOL 350 MG/ML SOLN
75.0000 mL | Freq: Once | INTRAVENOUS | Status: AC | PRN
  Administered 2024-02-27: 75 mL via INTRAVENOUS

## 2024-02-27 MED ORDER — PANTOPRAZOLE SODIUM 40 MG IV SOLR
40.0000 mg | Freq: Once | INTRAVENOUS | Status: AC
  Administered 2024-02-27: 40 mg via INTRAVENOUS
  Filled 2024-02-27: qty 10

## 2024-02-27 MED ORDER — SUCRALFATE 1 G PO TABS: 1.0000 g | ORAL_TABLET | Freq: Three times a day (TID) | ORAL | 0 refills | Status: AC

## 2024-02-27 MED ORDER — LACTATED RINGERS IV BOLUS
1000.0000 mL | Freq: Once | INTRAVENOUS | Status: AC
  Administered 2024-02-27: 1000 mL via INTRAVENOUS

## 2024-02-27 NOTE — Discharge Instructions (Addendum)
 You appear to have inflammation of your stomach/duodenum called gastritis.  We are treating this with antiacid medicine called Protonix  and Carafate .  It is important to cut back on alcohol as this is likely contributing.  Follow-up with your primary care provider and you are being were referred to gastroenterology as well.  If you develop new or worsening abdominal pain, chest pain, black or bloody stools, vomiting, or any other new/concerning symptoms then return to the ER.

## 2024-02-27 NOTE — ED Notes (Signed)
 Patient given water 

## 2024-02-27 NOTE — ED Notes (Signed)
 No answer in lobby for 2nd trop

## 2024-02-27 NOTE — ED Triage Notes (Signed)
 Pt BIB [facility] from home with c/o central chest pain that's been ongoing for 2 days. Reports it feels like he has a bowel blockage that is putting pressure on his heart. 324 aspirin PTA.Pt denies chest pain at this time.   190/120 CBG 91 HR 105 97% room air

## 2024-02-27 NOTE — ED Provider Triage Note (Signed)
 Emergency Medicine Provider Triage Evaluation Note  Alex Torres , a 69 y.o. male  was evaluated in triage.  Pt complains of abd pain. Report pain around abdomen that wraps to his back ongoing for 3-4 days.  Pain now affecting his chest.  Some nausea.  No fever, chills, cough, sob, vomiting, dysuria.  Does have hx of alcohol pancreatitis and pain felt similar.  Last alcohol use was last night  Review of Systems  Positive: As above Negative: As above  Physical Exam  BP (!) 149/101   Pulse (!) 108   Temp 98.9 F (37.2 C) (Oral)   Resp 16   SpO2 98%  Gen:   Awake, no distress   Resp:  Normal effort  MSK:   Moves extremities without difficulty  Other:    Medical Decision Making  Medically screening exam initiated at 10:06 AM.  Appropriate orders placed.  Alex Torres was informed that the remainder of the evaluation will be completed by another provider, this initial triage assessment does not replace that evaluation, and the importance of remaining in the ED until their evaluation is complete.     Nivia Colon, PA-C 02/27/24 1007

## 2024-02-27 NOTE — ED Notes (Signed)
 Patient transported to X-ray

## 2024-02-27 NOTE — ED Provider Notes (Addendum)
 " [facility] EMERGENCY DEPARTMENT AT [facility] Provider Note   CSN: [id number] Arrival date & time: 02/27/24  9164     Patient presents with: Chest Pain   Alex Torres is a 69 y.o. male.   HPI 69 year old male with a history of pancreatitis induced by alcohol presents with abdominal pain.  He is had upper abdominal pain, more on the right side, for a couple days.  Pain is pretty mild at this time.  No vomiting or black or bloody stools.  The pain is going into his chest as well.  He drinks alcohol daily.  He is also complaining of nasal congestion and his ears feeling stopped up.  No urinary symptoms.  He is very hard of hearing.  Prior to Admission medications  Medication Sig Start Date End Date Taking? Authorizing Provider  pantoprazole  (PROTONIX ) 40 MG tablet Take 1 tablet (40 mg total) by mouth 2 (two) times daily. 02/27/24 03/28/24 Yes Freddi Hamilton, MD  sucralfate  (CARAFATE ) 1 g tablet Take 1 tablet (1 g total) by mouth 4 (four) times daily -  with meals and at bedtime. 02/27/24 03/28/24 Yes Freddi Hamilton, MD  lisinopril  (PRINIVIL ,ZESTRIL ) 20 MG tablet Take 20 mg by mouth daily.    [provider]  polyethylene glycol (MIRALAX  / GLYCOLAX ) 17 g packet Take 17 g by mouth daily as needed. 10/09/19   Briana Joya BIRCH, MD  senna-docusate (SENOKOT-S) 8.6-50 MG tablet Take 2 tablets by mouth 2 (two) times daily. 10/09/19   Briana Joya BIRCH, MD    Allergies: Oxycodone     Review of Systems  Constitutional:  Negative for fever.  HENT:  Positive for congestion and ear pain.   Respiratory:  Positive for shortness of breath. Negative for cough.   Cardiovascular:  Positive for chest pain.  Gastrointestinal:  Positive for abdominal pain. Negative for blood in stool and vomiting.    Updated Vital Signs BP (!) 141/95   Pulse 100   Temp 98.6 F (37 C) (Oral)   Resp 18   SpO2 97%   Physical Exam Vitals and nursing note reviewed.  Constitutional:      General: He is not  in acute distress.    Appearance: He is well-developed. He is not ill-appearing or diaphoretic.  HENT:     Head: Normocephalic and atraumatic.     Ears:     Comments: Ear wax in both ears, besides this no obvious otitis media or externa Cardiovascular:     Rate and Rhythm: Regular rhythm. Tachycardia present.     Heart sounds: Normal heart sounds.     Comments: HR~100 Pulmonary:     Effort: Pulmonary effort is normal.     Breath sounds: Normal breath sounds. No wheezing, rhonchi or rales.  Abdominal:     Palpations: Abdomen is soft.     Tenderness: There is no abdominal tenderness.  Skin:    General: Skin is warm and dry.  Neurological:     Mental Status: He is alert.     (all labs ordered are listed, but only abnormal results are displayed) Labs Reviewed  BASIC METABOLIC PANEL WITH GFR - Abnormal; Notable for the following components:      Result Value   Sodium 131 (*)    Chloride 96 (*)    CO2 19 (*)    Glucose, Bld 151 (*)    BUN 6 (*)    Anion gap 16 (*)    All other components within normal limits  CBC - Abnormal; Notable for the following components:   RDW 17.9 (*)    All other components within normal limits  URINALYSIS, ROUTINE W REFLEX MICROSCOPIC - Abnormal; Notable for the following components:   Specific Gravity, Urine 1.060 (*)    Ketones, ur 20 (*)    All other components within normal limits  LIPASE, BLOOD  HEPATIC FUNCTION PANEL  TROPONIN T, HIGH SENSITIVITY  TROPONIN T, HIGH SENSITIVITY    EKG: EKG Interpretation Date/Time:  Wednesday February 27 2024 08:43:38 EST Ventricular Rate:  107 PR Interval:  192 QRS Duration:  130 QT Interval:  408 QTC Calculation: 544 R Axis:   58  Text Interpretation: Sinus tachycardia with Premature atrial complexes Right bundle branch block T wave abnormality, consider inferolateral ischemia Abnormal ECG When compared with ECG of 29-Sep-2017 05:17, PREVIOUS ECG IS PRESENT Confirmed by Yolande Charleston [phone]) on  02/27/2024 9:49:48 AM  Radiology: CT ABDOMEN PELVIS W CONTRAST Result Date: 02/27/2024 EXAM: CT ABDOMEN AND PELVIS WITH CONTRAST 02/27/2024 03:04:00 PM TECHNIQUE: CT of the abdomen and pelvis was performed with the administration of 75 mL of iohexol  (OMNIPAQUE ) 350 MG/ML injection. Multiplanar reformatted images are provided for review. Automated exposure control, iterative reconstruction, and/or weight-based adjustment of the mA/kV was utilized to reduce the radiation dose to as low as reasonably achievable. COMPARISON: 10/08/2019 CLINICAL HISTORY: Pancreatitis suspected. FINDINGS: LOWER CHEST: Multivessel coronary atherosclerosis. Posterior bibasilar dependent atelectasis. LIVER: Diffuse hepatic steatosis. Mildly echogenicity of the liver. GALLBLADDER AND BILE DUCTS: Gallbladder is unremarkable. No biliary ductal dilatation. 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 PANCREAS: A couple of mildly enlarged peripancreatic lymph nodes are also present (axial 26 and 27), likely reactive. ADRENAL GLANDS: No acute abnormality. KIDNEYS, URETERS AND BLADDER: Unchanged right renal cyst measuring 1 cm. Per consensus, no follow-up is needed for simple Bosniak type 1 and 2 renal cysts, unless the patient has a malignancy history or risk factors. No stones in the kidneys or ureters. No hydronephrosis. No perinephric or periureteral stranding. Mild circumferential wall thickening of the urinary bladder, which is decompressed. GI AND BOWEL: Small air fluid level in the stomach. Moderate submucosal edema throughout the 1st and 2nd portions of the duodenum with inflammation. Normal appendix. There is no bowel obstruction. PERITONEUM AND RETROPERITONEUM: No ascites. No free air. VASCULATURE: Aorta is normal in caliber. LYMPH NODES: Mildly enlarged peripancreatic lymph nodes are present (axial 26 and 27), likely reactive. REPRODUCTIVE ORGANS: Mild prostatomegaly. BONES AND SOFT TISSUES:  Osteopenia. Unchanged benign lesion in the right sacral ala. Multilevel degenerative disc disease throughout the spine. Mild, likely chronic compression fracture of L2. No retropulsion. No focal soft tissue abnormality. IMPRESSION: 1. Moderate submucosal edema throughout the 1st and 2nd portions of the duodenum with inflammation, consistent with either duodenitis or peptic ulcer disease. Correlation with serum lipase recommended to exclude acute pancreatitis. 2. A couple of mildly enlarged peripancreatic lymph nodes, likely reactive. 3. Severe diffuse hepatic steatosis. 4. Circumferential wall thickening of the urinary bladder, which may be due to underdistension. If there is concern for acute cystitis, correlation with urinalysis would be recommended. 5. Mild prostatomegaly. Electronically signed by: Rogelia Myers MD MD 02/27/2024 03:25 PM EST RP Workstation: [id number]   DG Chest 2 View Result Date: 02/27/2024 CLINICAL DATA:  Chest pain. EXAM: CHEST - 2 VIEW COMPARISON:  09/25/2017 FINDINGS: Lungs are hypoinflated and otherwise clear. Cardiomediastinal silhouette and remainder of the exam is unchanged. IMPRESSION: Hypoinflation without acute cardiopulmonary disease. Electronically Signed   By: Toribio  Jearld M.D.   On: 02/27/2024 09:33     Procedures   Medications Ordered in the ED  iohexol  (OMNIPAQUE ) 350 MG/ML injection 75 mL (75 mLs Intravenous Contrast Given 02/27/24 1505)  lactated ringers  bolus 1,000 mL (0 mLs Intravenous Stopped 02/27/24 2222)  pantoprazole  (PROTONIX ) injection 40 mg (40 mg Intravenous Given 02/27/24 2045)                                    Medical Decision Making Amount and/or Complexity of Data Reviewed External Data Reviewed: notes. Labs: ordered.    Details: Troponins 15 and 17, not c/w MI Normal lipase Radiology: ordered and independent interpretation performed. ECG/medicine tests: ordered and independent interpretation performed.    Details: Sinus  tachycardia  Risk Prescription drug management.   Patient's pain seems to be coming from duodenitis.  This would correlate with his right-sided abdominal pain.  He states it does not quite feel like previous pancreatitis and lipase is normal.  Troponins are not consistent with MI.  He is denying any significant pain at this time.  Will put him on PPI and Carafate .  He was given some fluids.  He does have a mild anion gap acidosis but denies any vomiting.  Doubt he has sepsis or severe illness.  No signs of bleeding on history or exam.    He likely also has a viral URI.  No indication for antibiotics.  Was tachycardic though this is improving.    He is stable for discharge.  Will discharge to follow-up with PCP and GI.  Given return precautions.     Final diagnoses:  Duodenitis    ED Discharge Orders          Ordered    Ambulatory referral to Gastroenterology        02/27/24 2218    pantoprazole  (PROTONIX ) 40 MG tablet  2 times daily        02/27/24 2218    sucralfate  (CARAFATE ) 1 g tablet  3 times daily with meals & bedtime        02/27/24 2218               Freddi Hamilton, MD 02/27/24 7753    Freddi Hamilton, MD 02/27/24 2250  "
